# Patient Record
Sex: Female | Born: 1985
Health system: Southern US, Community
[De-identification: ages and names within clinical notes are randomized; demographics above are authoritative.]

## PROBLEM LIST (undated history)

## (undated) ENCOUNTER — Emergency Department (HOSPITAL_COMMUNITY): Admission: EM | Payer: 59 | Source: Home / Self Care

## (undated) DIAGNOSIS — K219 Gastro-esophageal reflux disease without esophagitis: Secondary | ICD-10-CM

## (undated) DIAGNOSIS — R519 Headache, unspecified: Secondary | ICD-10-CM

## (undated) DIAGNOSIS — T7840XA Allergy, unspecified, initial encounter: Secondary | ICD-10-CM

## (undated) DIAGNOSIS — F32A Depression, unspecified: Secondary | ICD-10-CM

## (undated) DIAGNOSIS — F419 Anxiety disorder, unspecified: Secondary | ICD-10-CM

## (undated) DIAGNOSIS — K602 Anal fissure, unspecified: Secondary | ICD-10-CM

## (undated) HISTORY — DX: Anal fissure, unspecified: K60.2

## (undated) HISTORY — DX: Depression, unspecified: F32.A

## (undated) HISTORY — DX: Allergy, unspecified, initial encounter: T78.40XA

## (undated) HISTORY — PX: LAPAROSCOPY ABDOMEN DIAGNOSTIC: PRO50

## (undated) HISTORY — DX: Gastro-esophageal reflux disease without esophagitis: K21.9

## (undated) HISTORY — DX: Anxiety disorder, unspecified: F41.9

---
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
## 2004-02-04 ENCOUNTER — Emergency Department (HOSPITAL_COMMUNITY): Admission: EM | Admit: 2004-02-04 | Discharge: 2004-02-05 | Payer: Self-pay | Admitting: Emergency Medicine

## 2004-02-12 ENCOUNTER — Emergency Department (HOSPITAL_COMMUNITY): Admission: EM | Admit: 2004-02-12 | Discharge: 2004-02-12 | Payer: Self-pay | Admitting: Family Medicine

## 2004-05-04 ENCOUNTER — Emergency Department (HOSPITAL_COMMUNITY): Admission: EM | Admit: 2004-05-04 | Discharge: 2004-05-04 | Payer: Self-pay | Admitting: *Deleted

## 2004-06-11 ENCOUNTER — Emergency Department (HOSPITAL_COMMUNITY): Admission: EM | Admit: 2004-06-11 | Discharge: 2004-06-11 | Payer: Self-pay | Admitting: Family Medicine

## 2004-06-14 ENCOUNTER — Emergency Department (HOSPITAL_COMMUNITY): Admission: EM | Admit: 2004-06-14 | Discharge: 2004-06-14 | Payer: Self-pay | Admitting: Family Medicine

## 2004-07-28 ENCOUNTER — Emergency Department (HOSPITAL_COMMUNITY): Admission: EM | Admit: 2004-07-28 | Discharge: 2004-07-28 | Payer: Self-pay | Admitting: Family Medicine

## 2004-07-28 ENCOUNTER — Ambulatory Visit (HOSPITAL_COMMUNITY): Admission: RE | Admit: 2004-07-28 | Discharge: 2004-07-28 | Payer: Self-pay | Admitting: Family Medicine

## 2004-12-05 ENCOUNTER — Emergency Department (HOSPITAL_COMMUNITY): Admission: EM | Admit: 2004-12-05 | Discharge: 2004-12-05 | Payer: Self-pay | Admitting: Family Medicine

## 2004-12-05 ENCOUNTER — Emergency Department (HOSPITAL_COMMUNITY): Admission: EM | Admit: 2004-12-05 | Discharge: 2004-12-05 | Payer: Self-pay | Admitting: Diagnostic Radiology

## 2005-01-07 ENCOUNTER — Emergency Department (HOSPITAL_COMMUNITY): Admission: EM | Admit: 2005-01-07 | Discharge: 2005-01-07 | Payer: Self-pay | Admitting: Family Medicine

## 2005-09-11 ENCOUNTER — Emergency Department (HOSPITAL_COMMUNITY): Admission: EM | Admit: 2005-09-11 | Discharge: 2005-09-11 | Payer: Self-pay | Admitting: Family Medicine

## 2005-11-01 ENCOUNTER — Emergency Department (HOSPITAL_COMMUNITY): Admission: EM | Admit: 2005-11-01 | Discharge: 2005-11-01 | Payer: Self-pay | Admitting: Family Medicine

## 2006-05-21 ENCOUNTER — Emergency Department (HOSPITAL_COMMUNITY): Admission: EM | Admit: 2006-05-21 | Discharge: 2006-05-21 | Payer: Self-pay | Admitting: Emergency Medicine

## 2006-11-03 ENCOUNTER — Emergency Department (HOSPITAL_COMMUNITY): Admission: EM | Admit: 2006-11-03 | Discharge: 2006-11-03 | Payer: Self-pay | Admitting: Family Medicine

## 2007-01-12 ENCOUNTER — Emergency Department (HOSPITAL_COMMUNITY): Admission: EM | Admit: 2007-01-12 | Discharge: 2007-01-12 | Payer: Self-pay | Admitting: Family Medicine

## 2007-05-02 ENCOUNTER — Emergency Department (HOSPITAL_COMMUNITY): Admission: EM | Admit: 2007-05-02 | Discharge: 2007-05-02 | Payer: Self-pay | Admitting: Emergency Medicine

## 2007-08-01 ENCOUNTER — Emergency Department (HOSPITAL_COMMUNITY): Admission: EM | Admit: 2007-08-01 | Discharge: 2007-08-01 | Payer: Self-pay | Admitting: Emergency Medicine

## 2008-07-12 ENCOUNTER — Emergency Department (HOSPITAL_COMMUNITY): Admission: EM | Admit: 2008-07-12 | Discharge: 2008-07-12 | Payer: Self-pay | Admitting: Emergency Medicine

## 2008-09-06 HISTORY — PX: HEMORROIDECTOMY: SUR656

## 2008-12-17 ENCOUNTER — Emergency Department (HOSPITAL_COMMUNITY): Admission: EM | Admit: 2008-12-17 | Discharge: 2008-12-17 | Payer: Self-pay | Admitting: Emergency Medicine

## 2009-01-18 ENCOUNTER — Emergency Department (HOSPITAL_COMMUNITY): Admission: EM | Admit: 2009-01-18 | Discharge: 2009-01-19 | Payer: Self-pay | Admitting: Emergency Medicine

## 2009-01-22 ENCOUNTER — Encounter (INDEPENDENT_AMBULATORY_CARE_PROVIDER_SITE_OTHER): Payer: Self-pay | Admitting: Surgery

## 2009-01-22 ENCOUNTER — Ambulatory Visit (HOSPITAL_COMMUNITY): Admission: RE | Admit: 2009-01-22 | Discharge: 2009-01-22 | Payer: Self-pay | Admitting: Surgery

## 2009-11-25 ENCOUNTER — Emergency Department (HOSPITAL_COMMUNITY): Admission: EM | Admit: 2009-11-25 | Discharge: 2009-11-25 | Payer: Self-pay | Admitting: Family Medicine

## 2009-11-25 ENCOUNTER — Emergency Department (HOSPITAL_COMMUNITY): Admission: EM | Admit: 2009-11-25 | Discharge: 2009-11-26 | Payer: Self-pay | Admitting: Emergency Medicine

## 2010-06-18 ENCOUNTER — Emergency Department (HOSPITAL_COMMUNITY): Admission: EM | Admit: 2010-06-18 | Discharge: 2010-06-18 | Payer: Self-pay | Admitting: Emergency Medicine

## 2010-08-29 ENCOUNTER — Emergency Department (HOSPITAL_COMMUNITY)
Admission: EM | Admit: 2010-08-29 | Discharge: 2010-08-29 | Payer: Self-pay | Source: Home / Self Care | Admitting: Emergency Medicine

## 2010-10-27 ENCOUNTER — Emergency Department (HOSPITAL_COMMUNITY)
Admission: EM | Admit: 2010-10-27 | Discharge: 2010-10-27 | Disposition: A | Discharge status: Home / Self Care | Payer: 59 | Attending: Emergency Medicine | Admitting: Emergency Medicine

## 2010-10-27 ENCOUNTER — Emergency Department (HOSPITAL_COMMUNITY): Payer: 59

## 2010-10-27 DIAGNOSIS — R6883 Chills (without fever): Secondary | ICD-10-CM | POA: Insufficient documentation

## 2010-10-27 DIAGNOSIS — Z79899 Other long term (current) drug therapy: Secondary | ICD-10-CM | POA: Insufficient documentation

## 2010-10-27 DIAGNOSIS — R109 Unspecified abdominal pain: Secondary | ICD-10-CM | POA: Insufficient documentation

## 2010-10-27 DIAGNOSIS — R10819 Abdominal tenderness, unspecified site: Secondary | ICD-10-CM | POA: Insufficient documentation

## 2010-10-27 DIAGNOSIS — R110 Nausea: Secondary | ICD-10-CM | POA: Insufficient documentation

## 2010-10-27 DIAGNOSIS — R197 Diarrhea, unspecified: Secondary | ICD-10-CM | POA: Insufficient documentation

## 2010-10-27 LAB — DIFFERENTIAL
Basophils Relative: 0 % (ref 0–1)
Lymphs Abs: 1.2 10*3/uL (ref 0.7–4.0)
Neutro Abs: 9.1 10*3/uL — ABNORMAL HIGH (ref 1.7–7.7)
Neutrophils Relative %: 85 % — ABNORMAL HIGH (ref 43–77)

## 2010-10-27 LAB — WET PREP, GENITAL: Clue Cells Wet Prep HPF POC: NONE SEEN

## 2010-10-27 LAB — URINE MICROSCOPIC-ADD ON

## 2010-10-27 LAB — COMPREHENSIVE METABOLIC PANEL
ALT: 15 U/L (ref 0–35)
Alkaline Phosphatase: 57 U/L (ref 39–117)
CO2: 25 mEq/L (ref 19–32)
Chloride: 106 mEq/L (ref 96–112)
Creatinine, Ser: 0.81 mg/dL (ref 0.4–1.2)
GFR calc Af Amer: >60 mL/min (ref >60)
GFR calc non Af Amer: >60 mL/min (ref >60)
Glucose, Bld: 112 mg/dL — ABNORMAL HIGH (ref 70–99)
Potassium: 3.6 mEq/L (ref 3.5–5.1)
Total Bilirubin: 1.1 mg/dL (ref 0.3–1.2)

## 2010-10-27 LAB — CBC
MCHC: 32.9 g/dL (ref 30.0–36.0)
Platelets: 282 10*3/uL (ref 150–400)
RDW: 12.6 % (ref 11.5–15.5)
WBC: 10.8 10*3/uL — ABNORMAL HIGH (ref 4.0–10.5)

## 2010-10-27 LAB — URINALYSIS, ROUTINE W REFLEX MICROSCOPIC
Bilirubin Urine: NEGATIVE
Protein, ur: NEGATIVE mg/dL
Specific Gravity, Urine: 1.015 (ref 1.005–1.030)
Urine Glucose, Fasting: NEGATIVE mg/dL
Urobilinogen, UA: 0.2 mg/dL (ref 0.0–1.0)
pH: 7.5 (ref 5.0–8.0)

## 2010-10-27 MED ORDER — IOHEXOL 300 MG/ML  SOLN
100.0000 mL | Freq: Once | INTRAMUSCULAR | Status: AC | PRN
  Administered 2010-10-27: 100 mL via INTRAVENOUS

## 2010-10-28 LAB — GC/CHLAMYDIA PROBE AMP, GENITAL: GC Probe Amp, Genital: NEGATIVE

## 2010-11-27 LAB — COMPREHENSIVE METABOLIC PANEL
ALT: 14 U/L (ref 0–35)
AST: 18 U/L (ref 0–37)
Alkaline Phosphatase: 75 U/L (ref 39–117)
CO2: 25 mEq/L (ref 19–32)
Chloride: 103 mEq/L (ref 96–112)
Creatinine, Ser: 0.85 mg/dL (ref 0.4–1.2)
GFR calc Af Amer: >60 mL/min (ref >60)
GFR calc non Af Amer: >60 mL/min (ref >60)
Potassium: 3.4 mEq/L — ABNORMAL LOW (ref 3.5–5.1)
Sodium: 138 mEq/L (ref 135–145)
Total Bilirubin: 0.5 mg/dL (ref 0.3–1.2)

## 2010-11-27 LAB — URINALYSIS, ROUTINE W REFLEX MICROSCOPIC
Bilirubin Urine: NEGATIVE
Glucose, UA: NEGATIVE mg/dL
Hgb urine dipstick: NEGATIVE
Protein, ur: NEGATIVE mg/dL
Urobilinogen, UA: 0.2 mg/dL (ref 0.0–1.0)

## 2010-11-27 LAB — POCT URINALYSIS DIP (DEVICE)
Bilirubin Urine: NEGATIVE
Glucose, UA: NEGATIVE mg/dL
Hgb urine dipstick: NEGATIVE
Nitrite: NEGATIVE

## 2010-11-27 LAB — DIFFERENTIAL
Basophils Absolute: 0 10*3/uL (ref 0.0–0.1)
Eosinophils Absolute: 0.1 10*3/uL (ref 0.0–0.7)
Lymphs Abs: 3.2 10*3/uL (ref 0.7–4.0)
Monocytes Absolute: 0.8 10*3/uL (ref 0.1–1.0)
Neutro Abs: 6.7 10*3/uL (ref 1.7–7.7)

## 2010-11-27 LAB — POCT PREGNANCY, URINE
Preg Test, Ur: NEGATIVE
Preg Test, Ur: NEGATIVE

## 2010-11-27 LAB — CBC
MCV: 88.2 fL (ref 78.0–100.0)
RBC: 4.33 MIL/uL (ref 3.87–5.11)
WBC: 10.8 10*3/uL — ABNORMAL HIGH (ref 4.0–10.5)

## 2010-11-27 LAB — URINE MICROSCOPIC-ADD ON

## 2010-12-15 LAB — DIFFERENTIAL
Basophils Absolute: 0.3 10*3/uL — ABNORMAL HIGH (ref 0.0–0.1)
Basophils Relative: 2 % — ABNORMAL HIGH (ref 0–1)
Eosinophils Absolute: 0.2 10*3/uL (ref 0.0–0.7)
Eosinophils Relative: 1 % (ref 0–5)
Monocytes Absolute: 0.8 10*3/uL (ref 0.1–1.0)
Monocytes Relative: 6 % (ref 3–12)
Neutro Abs: 8.7 10*3/uL — ABNORMAL HIGH (ref 1.7–7.7)

## 2010-12-15 LAB — URINALYSIS, ROUTINE W REFLEX MICROSCOPIC
Glucose, UA: NEGATIVE mg/dL
Hgb urine dipstick: NEGATIVE
Ketones, ur: NEGATIVE mg/dL
Protein, ur: NEGATIVE mg/dL
Urobilinogen, UA: 0.2 mg/dL (ref 0.0–1.0)

## 2010-12-15 LAB — CBC
Hemoglobin: 12.8 g/dL (ref 12.0–15.0)
MCHC: 33.1 g/dL (ref 30.0–36.0)
MCV: 86.8 fL (ref 78.0–100.0)
RBC: 4.46 MIL/uL (ref 3.87–5.11)
RDW: 13.1 % (ref 11.5–15.5)

## 2010-12-15 LAB — WET PREP, GENITAL: Yeast Wet Prep HPF POC: NONE SEEN

## 2010-12-15 LAB — POCT I-STAT, CHEM 8
BUN: 12 mg/dL (ref 6–23)
Calcium, Ion: 1.12 mmol/L (ref 1.12–1.32)
Chloride: 107 mEq/L (ref 96–112)
Creatinine, Ser: 0.9 mg/dL (ref 0.4–1.2)
Glucose, Bld: 89 mg/dL (ref 70–99)
TCO2: 21 mmol/L (ref 0–100)

## 2010-12-15 LAB — URINE MICROSCOPIC-ADD ON

## 2010-12-15 LAB — RPR: RPR Ser Ql: NONREACTIVE

## 2010-12-15 LAB — HEMOCCULT GUIAC POC 1CARD (OFFICE): Fecal Occult Bld: NEGATIVE

## 2010-12-16 LAB — DIFFERENTIAL
Basophils Absolute: 0.1 10*3/uL (ref 0.0–0.1)
Basophils Relative: 1 % (ref 0–1)
Eosinophils Relative: 4 % (ref 0–5)
Monocytes Absolute: 0.5 10*3/uL (ref 0.1–1.0)
Neutro Abs: 5.1 10*3/uL (ref 1.7–7.7)

## 2010-12-16 LAB — CBC
HCT: 37.3 % (ref 36.0–46.0)
Hemoglobin: 13.2 g/dL (ref 12.0–15.0)
MCV: 86.6 fL (ref 78.0–100.0)
Platelets: 240 10*3/uL (ref 150–400)
RBC: 4.31 MIL/uL (ref 3.87–5.11)
WBC: 7.8 10*3/uL (ref 4.0–10.5)

## 2010-12-16 LAB — COMPREHENSIVE METABOLIC PANEL
Albumin: 4 g/dL (ref 3.5–5.2)
Alkaline Phosphatase: 57 U/L (ref 39–117)
BUN: 10 mg/dL (ref 6–23)
CO2: 26 mEq/L (ref 19–32)
Chloride: 106 mEq/L (ref 96–112)
Creatinine, Ser: 0.79 mg/dL (ref 0.4–1.2)
GFR calc non Af Amer: >60 mL/min (ref >60)
Potassium: 3.7 mEq/L (ref 3.5–5.1)
Total Bilirubin: 0.7 mg/dL (ref 0.3–1.2)

## 2010-12-16 LAB — URINALYSIS, ROUTINE W REFLEX MICROSCOPIC
Bilirubin Urine: NEGATIVE
Hgb urine dipstick: NEGATIVE
Nitrite: NEGATIVE
Protein, ur: NEGATIVE mg/dL
Urobilinogen, UA: 0.2 mg/dL (ref 0.0–1.0)

## 2010-12-16 LAB — PREGNANCY, URINE: Preg Test, Ur: NEGATIVE

## 2010-12-16 LAB — GC/CHLAMYDIA PROBE AMP, GENITAL: Chlamydia, DNA Probe: NEGATIVE

## 2011-01-19 NOTE — Op Note (Signed)
NAME:  Alicia Beltran, Alicia Beltran         ACCOUNT NO.:  [id number]   MEDICAL RECORD NO.:  [id number]          PATIENT TYPE:  AMB   LOCATION:  DAY                          FACILITY:  [facility]   PHYSICIAN:  Wilmon Arms. Corliss Skains, M.D. DATE OF BIRTH:  1985/09/25   DATE OF PROCEDURE:  01/22/2009  DATE OF DISCHARGE:                               OPERATIVE REPORT   PREOPERATIVE DIAGNOSIS:  Perirectal pain   POSTOPERATIVE DIAGNOSIS:  Anal fissure, internal hemorrhoid.   PROCEDURE PERFORMED:  Examination under anesthesia and internal  hemorrhoidectomy.   SURGEON:  Wilmon Arms. Tsuei, M.D.   ANESTHESIA:  General.   INDICATIONS:  This is a 25 year old female who presents with several  weeks of severe perirectal pain located mostly in the posterior left  quadrant.  She had a course of antibiotics for a presumed early  perirectal abscess.  However, this did not seem to help.  She was  examined again and there was no sign of swelling or erythema.  She was  exquisitely tender and the decision was made to proceed for an  examination under anesthesia.   OPERATIVE FINDINGS:  The patient has a fairly large fissure in the left  posterior quadrant.  Just inside of this she has a small, nonprolapsing  internal hemorrhoid which did not appear to be bleeding.   DESCRIPTION OF PROCEDURE:  The patient was brought to the operating room  and placed in supine position on the operating room table.  After an  adequate level of general anesthesia was obtained the patient's legs  were placed in lithotomy position in Yellofin stirrups.  Her perineum  was prepped with Betadine and draped in a sterile fashion.  A time-out  was taken to ensure the proper patient and proper procedure.  We  infiltrated the area around the anus with quarter percent Marcaine with  epinephrine.  A lubricated finger was inserted to gently dilate the  sphincter.  We carefully examined the perianal skin all around.  There  was no sign of abscess.   There was no erythema.  There was no sign of  fistula.  On close examination the patient has a tiny bit of loose  external hemorrhoidal tissue and there was a fairly large fissure in the  left posterior quadrant.  This was not located in the midline.  We were  able to dilate the anus up to three fingers and then insert a  SilverBullet retractor.  Circumferential inspection showed only this  single fissure.  There was some oozing from this spot.  Just inside of  this above the dentate line there was an internal hemorrhoid which is  long but not inflamed or bleeding.  This was excised with cautery.  Circumferential inspection showed no other significant findings in the  rectum.  We thoroughly infiltrated with quarter percent Marcaine with  epinephrine.  I used a 3-0 Vicryl to close the mucosal edges of our  internal hemorrhoidectomy site.  We incorporated  the edges of the fissure in this closure.  This area was then covered  with a small piece of Gelfoam covered in dibucaine ointment.  The  retractors were  removed.  A dry dressing was placed and the patient was  extubated and brought to recovery in stable condition.  All sponge,  instrument and needle counts were correct.      Wilmon Arms. Tsuei, M.D.  Electronically Signed     MKT/MEDQ  D:  01/22/2009  T:  01/22/2009  Job:  [id number]

## 2011-01-22 NOTE — Op Note (Signed)
[facility]  Patient:    Alicia Beltran, Alicia Beltran Visit Number: [id number] MRN: [id number]          Service Type: EMS Location: [facility] Attending Physician:  Devoria Albe Dictated by:   S. Kyra Manges, M.D. Proc. Date: 09/22/01 Admit Date:  09/08/2001 Discharge Date: 09/08/2001                             Operative Report  PREOPERATIVE DIAGNOSES: 1. Pelvic pain. 2. Abdominal pain of uncertain etiology.  POSTOPERATIVE DIAGNOSIS:  Essentially normal laparoscopy.  DESCRIPTION OF PROCEDURE:  The patient was placed in lithotomy position and prepped and draped in the usual fashion.  Bladder was emptied.  The Hulka elevator was inserted into the cervix.  Transverse incision was made in the abdomen after appropriate prep and drape, and a Veress needle was used to insufflate the abdominal cavity.  Aspiration infusion technique was utilized. The trocar was inserted into the abdomen, and visualization of the pelvis revealed two normal tubes and ovaries.  There was no evidence of endometriosis.  Both ureters peristalsed nicely.  The cecum was visualized; it was normal.  There was no bowel erythema or thickening that we could appreciate on laparoscopy.  The appendix was visualized and normal.  The gallbladder was normal.  The liver was normal.  There were no omental thickenings or changes in the omentum indicating any inflammatory process within the abdomen.  We looked anteriorly and posteriorly, particularly in the cul-de-sac, traced the uterosacral ligaments to their insertion and found absolutely nothing within her abdomen that we could find that would be causing her abdominal pain.  The gas was then evacuated and the incisions closed with 3-0 Dexon, and the Hulka elevator was removed.  [name] tolerated this procedure well and was sent to the recovery room in good condition. Dictated by:   S. Kyra Manges, M.D. Attending Physician:  Devoria Albe DD:  09/22/01 TD:   09/24/01 Job: [id number]

## 2011-03-04 ENCOUNTER — Emergency Department (HOSPITAL_COMMUNITY): Payer: No Typology Code available for payment source

## 2011-03-04 ENCOUNTER — Emergency Department (HOSPITAL_COMMUNITY)
Admission: EM | Admit: 2011-03-04 | Discharge: 2011-03-04 | Disposition: A | Discharge status: Home / Self Care | Payer: No Typology Code available for payment source | Attending: Emergency Medicine | Admitting: Emergency Medicine

## 2011-03-04 DIAGNOSIS — M545 Low back pain, unspecified: Secondary | ICD-10-CM | POA: Insufficient documentation

## 2011-03-04 DIAGNOSIS — M542 Cervicalgia: Secondary | ICD-10-CM | POA: Insufficient documentation

## 2011-03-26 ENCOUNTER — Emergency Department (HOSPITAL_COMMUNITY)
Admission: EM | Admit: 2011-03-26 | Discharge: 2011-03-26 | Disposition: A | Discharge status: Home / Self Care | Payer: No Typology Code available for payment source | Attending: Emergency Medicine | Admitting: Emergency Medicine

## 2011-03-26 DIAGNOSIS — M549 Dorsalgia, unspecified: Secondary | ICD-10-CM | POA: Insufficient documentation

## 2011-03-26 DIAGNOSIS — M542 Cervicalgia: Secondary | ICD-10-CM | POA: Insufficient documentation

## 2011-06-08 LAB — POCT I-STAT, CHEM 8
BUN: 10
Calcium, Ion: 1.23
Chloride: 105
Creatinine, Ser: 0.9
Glucose, Bld: 84
HCT: 39
Hemoglobin: 13.3
Potassium: 3.7
Sodium: 140
TCO2: 25

## 2011-06-08 LAB — URINALYSIS, ROUTINE W REFLEX MICROSCOPIC
Glucose, UA: NEGATIVE
Nitrite: NEGATIVE
Protein, ur: NEGATIVE

## 2011-06-08 LAB — URINE MICROSCOPIC-ADD ON

## 2011-06-15 LAB — GC/CHLAMYDIA PROBE AMP, GENITAL: Chlamydia, DNA Probe: NEGATIVE

## 2011-06-15 LAB — URINALYSIS, ROUTINE W REFLEX MICROSCOPIC
Ketones, ur: NEGATIVE
Protein, ur: NEGATIVE
Urobilinogen, UA: 1

## 2011-06-15 LAB — RPR: RPR Ser Ql: NONREACTIVE

## 2011-06-15 LAB — PREGNANCY, URINE: Preg Test, Ur: NEGATIVE

## 2011-06-18 LAB — URINALYSIS, ROUTINE W REFLEX MICROSCOPIC
Bilirubin Urine: NEGATIVE
Glucose, UA: NEGATIVE
Hgb urine dipstick: NEGATIVE
Protein, ur: NEGATIVE
Urobilinogen, UA: 0.2

## 2011-06-18 LAB — POCT PREGNANCY, URINE: Preg Test, Ur: NEGATIVE

## 2011-06-25 ENCOUNTER — Other Ambulatory Visit (HOSPITAL_COMMUNITY): Payer: Self-pay | Admitting: Psychiatry

## 2011-06-25 DIAGNOSIS — M5020 Other cervical disc displacement, unspecified cervical region: Secondary | ICD-10-CM

## 2011-06-28 ENCOUNTER — Ambulatory Visit (HOSPITAL_COMMUNITY)
Admission: RE | Admit: 2011-06-28 | Discharge: 2011-06-28 | Disposition: A | Discharge status: Home / Self Care | Payer: 59 | Source: Ambulatory Visit | Attending: Psychiatry | Admitting: Psychiatry

## 2011-06-28 DIAGNOSIS — M5020 Other cervical disc displacement, unspecified cervical region: Secondary | ICD-10-CM

## 2011-06-28 DIAGNOSIS — M542 Cervicalgia: Secondary | ICD-10-CM | POA: Insufficient documentation

## 2011-06-28 DIAGNOSIS — M25519 Pain in unspecified shoulder: Secondary | ICD-10-CM | POA: Insufficient documentation

## 2011-06-30 ENCOUNTER — Other Ambulatory Visit (HOSPITAL_COMMUNITY): Payer: Self-pay | Admitting: Family Medicine

## 2011-08-30 ENCOUNTER — Emergency Department (HOSPITAL_COMMUNITY)
Admission: EM | Admit: 2011-08-30 | Discharge: 2011-08-30 | Disposition: A | Discharge status: Home / Self Care | Payer: PRIVATE HEALTH INSURANCE | Attending: Emergency Medicine | Admitting: Emergency Medicine

## 2011-08-30 ENCOUNTER — Encounter: Payer: Self-pay | Admitting: *Deleted

## 2011-08-30 DIAGNOSIS — Y9210 Unspecified residential institution as the place of occurrence of the external cause: Secondary | ICD-10-CM | POA: Insufficient documentation

## 2011-08-30 DIAGNOSIS — W503XXA Accidental bite by another person, initial encounter: Secondary | ICD-10-CM

## 2011-08-30 DIAGNOSIS — Y990 Civilian activity done for income or pay: Secondary | ICD-10-CM | POA: Insufficient documentation

## 2011-08-30 DIAGNOSIS — S51809A Unspecified open wound of unspecified forearm, initial encounter: Secondary | ICD-10-CM | POA: Insufficient documentation

## 2011-08-30 MED ORDER — AMOXICILLIN-POT CLAVULANATE 500-125 MG PO TABS: 1.0000 | ORAL_TABLET | Freq: Three times a day (TID) | ORAL | Status: AC

## 2011-08-30 MED ORDER — TETANUS-DIPHTH-ACELL PERTUSSIS 5-2.5-18.5 LF-MCG/0.5 IM SUSP
0.5000 mL | Freq: Once | INTRAMUSCULAR | Status: AC
  Administered 2011-08-30: 0.5 mL via INTRAMUSCULAR
  Filled 2011-08-30: qty 0.5

## 2011-08-30 MED ORDER — IBUPROFEN 800 MG PO TABS
800.0000 mg | ORAL_TABLET | Freq: Once | ORAL | Status: AC
  Administered 2011-08-30: 800 mg via ORAL
  Filled 2011-08-30: qty 1
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
## 2011-08-30 NOTE — ED Provider Notes (Signed)
Medical screening examination/treatment/procedure(s) were performed by non-physician practitioner and as supervising physician I was immediately available for consultation/collaboration.   Sharika Mosquera, MD 08/30/11 2317 
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
## 2012-02-25 ENCOUNTER — Encounter (HOSPITAL_COMMUNITY): Payer: Self-pay | Admitting: *Deleted

## 2012-02-25 ENCOUNTER — Emergency Department (HOSPITAL_COMMUNITY)
Admission: EM | Admit: 2012-02-25 | Discharge: 2012-02-25 | Disposition: A | Discharge status: Home / Self Care | Payer: 59 | Attending: Emergency Medicine | Admitting: Emergency Medicine

## 2012-02-25 DIAGNOSIS — I951 Orthostatic hypotension: Secondary | ICD-10-CM

## 2012-02-25 DIAGNOSIS — I498 Other specified cardiac arrhythmias: Secondary | ICD-10-CM | POA: Insufficient documentation

## 2012-02-25 DIAGNOSIS — R002 Palpitations: Secondary | ICD-10-CM | POA: Insufficient documentation

## 2012-02-25 LAB — BASIC METABOLIC PANEL
GFR calc Af Amer: >90 mL/min (ref >90)
GFR calc non Af Amer: >90 mL/min (ref >90)
Glucose, Bld: 119 mg/dL — ABNORMAL HIGH (ref 70–99)
Potassium: 4.2 mEq/L (ref 3.5–5.1)
Sodium: 138 mEq/L (ref 135–145)

## 2012-02-25 LAB — CBC
Hemoglobin: 12.8 g/dL (ref 12.0–15.0)
MCHC: 33.4 g/dL (ref 30.0–36.0)

## 2012-02-25 LAB — GLUCOSE, CAPILLARY: Glucose-Capillary: 112 mg/dL — ABNORMAL HIGH (ref 70–99)

## 2012-02-25 MED ORDER — GI COCKTAIL ~~LOC~~
30.0000 mL | Freq: Once | ORAL | Status: AC
  Administered 2012-02-25: 30 mL via ORAL
  Filled 2012-02-25: qty 30

## 2012-02-25 MED ORDER — SODIUM CHLORIDE 0.9 % IV BOLUS (SEPSIS)
1000.0000 mL | Freq: Once | INTRAVENOUS | Status: AC
  Administered 2012-02-25: 1000 mL via INTRAVENOUS

## 2012-02-25 NOTE — Discharge Instructions (Signed)
If your symptoms get worse or do not improve with fluids then return to the doctor or emergency room. Make sure to not get up too quickly and do not exert yourself for next 24 hours.  Orthostatic Hypotension Orthostatic hypotension is a sudden fall in blood pressure. It occurs when a person goes from a sitting or lying position to a standing position. CAUSES   Loss of body fluids (dehydration).   Medicines that lower blood pressure.   Sudden changes in posture, such as sudden standing when you have been sitting or lying down.   Taking too much of your medicine.  SYMPTOMS   Lightheadedness or dizziness.   Fainting or near-fainting.   A fast heart rate (tachycardia).   Weakness.   Feeling tired (fatigue).  DIAGNOSIS  Your caregiver may find the cause of orthostatic hypotension through:  A history and/or physical exam.   Checking your blood pressure. Your caregiver will check your blood pressure when you are:   Lying down.   Sitting.   Standing.   Tilt table testing. In this test, you are placed on a table that goes from a lying position to a standing position. You will be strapped to the table. This test helps to monitor your blood pressure and heart rate when you are in different positions.  TREATMENT   If orthostatic hypotension is caused by your medicines, your caregiver will need to adjust your dosage. Do not stop or adjust your medicine on your own.   When changing positions, make these changes slowly. This allows your body to adjust to the different position.   Compression stockings that are worn on your lower legs may be helpful.   Your caregiver may have you consume extra salt. Do not add extra salt to your diet unless directed by your caregiver.   Eat frequent, small meals. Avoid sudden standing after eating.   Avoid hot showers or excessive heat.   Your caregiver may give you fluids through the vein (intravenous).   Your caregiver may put you on medicine  to help enhance fluid retention.  SEEK IMMEDIATE MEDICAL CARE IF:   You faint or have a near-fainting episode. Call your local emergency services (911 in [place]   You have or develop chest pain.   You feel sick to your stomach (nauseous) or vomit.   You have a loss of feeling or movement in your arms or legs.   You have difficulty talking, slurred speech, or you are unable to talk.   You have difficulty thinking or have confused thinking.  MAKE SURE YOU:   Understand these instructions.   Will watch your condition.   Will get help right away if you are not doing well or get worse.  Document Released: 08/13/2002 Document Revised: 08/12/2011 Document Reviewed: 12/06/2008 [facility] Patient Information 2012 [place], [place].

## 2012-02-25 NOTE — ED Notes (Signed)
Will ambulate pt when IV bag is empty.

## 2012-02-25 NOTE — ED Notes (Signed)
Pt was donating blood. Completed donation process and went back to work, pushed a hospital bed around and upon returning, felt lightheaded and was having palpitations. Co-worker reported pt's pulse being 140-150. Attempted to vaso-vagal without relief. Upon admission to ED, pts HR 103 on monitor but she continues to complain of palpitations and lightheadedness. Sts no previous similar symptoms, no previous adverse reaction after donating blood.

## 2012-02-25 NOTE — ED Provider Notes (Signed)
History     CSN: [id number]  Arrival date & time 02/25/12  1354   First MD Initiated Contact with Patient 02/25/12 1418      Chief Complaint  Patient presents with  . Palpitations    (Consider location/radiation/quality/duration/timing/severity/associated sxs/prior treatment) HPI Comments: 26 yo female with no significant PMH presents with palpitations after giving blood about one hour before. She has felt normal recently, no medications, eating and drinking normally. Gave blood without complications. 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 Did not resolve immediately so she presented for evaluation. No cardiac history, chest pain, dyspnea, leg swelling, weight changes, emesis, nausea. Symptoms are improved in bed currently.  Patient is a 26 y.o. female presenting with palpitations.  Palpitations  Pertinent negatives include no fever, no chest pain, no headaches and no shortness of breath.    History reviewed. No pertinent past medical history.  Past Surgical History  Procedure Date  . Laparoscopy abdomen diagnostic     No family history on file.  History  Substance Use Topics  . Smoking status: Never Smoker   . Smokeless tobacco: Not on file  . Alcohol Use: Yes     occ    OB History    Grav Para Term Preterm Abortions TAB SAB Ect Mult Living                  Review of Systems  Constitutional: Negative for fever, chills and activity change.  Respiratory: Negative for chest tightness and shortness of breath.   Cardiovascular: Positive for palpitations. Negative for chest pain and leg swelling.  Genitourinary: Negative for dysuria and hematuria.  Skin: Negative for rash.  Neurological: Negative for headaches.  All other systems reviewed and are negative.    Allergies  Review of patient's allergies indicates no known allergies.  Home Medications  No current outpatient  prescriptions on file.  BP 118/82  Pulse 114  Temp 97.6 F (36.4 C) (Oral)  SpO2 100%  LMP 12/28/2011  Physical Exam  Vitals reviewed. Constitutional: She is oriented to person, place, and time. She appears well-developed and well-nourished. No distress.  HENT:  Head: Normocephalic and atraumatic.       Lips are dry.  Eyes: EOM are normal. Pupils are equal, round, and reactive to light.  Neck: No thyromegaly present.  Cardiovascular: Regular rhythm, normal heart sounds and intact distal pulses.   No murmur heard.      Mild tachycardia  Pulmonary/Chest: Effort normal and breath sounds normal. No respiratory distress. She has no wheezes.  Abdominal: Soft. There is no tenderness.  Musculoskeletal: She exhibits no edema and no tenderness.  Neurological: She is alert and oriented to person, place, and time. No cranial nerve deficit. She exhibits normal muscle tone. Coordination normal.  Skin: No rash noted. She is not diaphoretic.  Psychiatric: She has a normal mood and affect.    ED Course  Procedures (including critical care time)  Date: 02/25/2012  Rate: 102  Rhythm: normal sinus rhythm  QRS Axis: normal  Intervals: normal  ST/T Wave abnormalities: nonspecific T wave changes  Conduction Disutrbances:none  Narrative Interpretation:   Old EKG Reviewed: none available    Labs Reviewed  GLUCOSE, CAPILLARY - Abnormal; Notable for the following:    Glucose-Capillary 112 (*)     All other components within normal limits  CBC  BASIC METABOLIC PANEL  TSH   Results for  orders placed during the hospital encounter of 02/25/12 (from the past 24 hour(s))  GLUCOSE, CAPILLARY     Status: Abnormal   Collection Time   02/25/12  2:32 PM      Component Value Range   Glucose-Capillary 112 (*) 70 - 99 mg/dL   Comment 1 Notify RN     Comment 2 Documented in Chart       No results found.   No diagnosis found.    MDM  25 yo female with palpitations, sinus tachycardia after  donating blood. This resolves with rest. Orthostatics are positive. No red flag symptoms. Will check CBC and hydrate one L bolus and follow up orthostatics.  3:27 PM Labs are normal, likely some dehydration and orthostasis immediately after blood donation. Discussed oral hydration, follow up for continued symptoms, worsening or syncope.       Durwin Reges, MD 02/25/12 [phone]

## 2012-02-27 NOTE — ED Provider Notes (Signed)
I  reviewed the resident's note and I agree with the findings and plan.      Nelia Shi, MD 02/27/12 [phone]

## 2013-02-09 ENCOUNTER — Encounter (HOSPITAL_COMMUNITY): Payer: Self-pay | Admitting: Emergency Medicine

## 2013-02-09 ENCOUNTER — Emergency Department (HOSPITAL_COMMUNITY)
Admission: EM | Admit: 2013-02-09 | Discharge: 2013-02-09 | Disposition: A | Discharge status: Home / Self Care | Payer: 59 | Source: Home / Self Care | Attending: Emergency Medicine | Admitting: Emergency Medicine

## 2013-02-09 DIAGNOSIS — R42 Dizziness and giddiness: Secondary | ICD-10-CM

## 2013-02-09 LAB — POCT I-STAT, CHEM 8
Creatinine, Ser: 0.7 mg/dL (ref 0.50–1.10)
Hemoglobin: 13.9 g/dL (ref 12.0–15.0)
Potassium: 3.6 mEq/L (ref 3.5–5.1)
Sodium: 142 mEq/L (ref 135–145)
TCO2: 24 mmol/L (ref 0–100)

## 2013-02-09 LAB — POCT URINALYSIS DIP (DEVICE)
Glucose, UA: NEGATIVE mg/dL
Ketones, ur: NEGATIVE mg/dL
Leukocytes, UA: NEGATIVE
Specific Gravity, Urine: 1.015 (ref 1.005–1.030)
Urobilinogen, UA: 0.2 mg/dL (ref 0.0–1.0)

## 2013-02-09 LAB — POCT PREGNANCY, URINE: Preg Test, Ur: NEGATIVE

## 2013-02-09 NOTE — ED Provider Notes (Signed)
Chief Complaint:   Chief Complaint  Patient presents with  . 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 She denies any headache, diplopia, difficulty with speech or swallowing, chest pain, shortness of breath, palpitations, vomiting, abdominal pain, diarrhea, urinary symptoms, GYN complaints, paresthesias, muscle weakness, or difficulty with coordination. She denies any whirling vertigo.  Review of Systems:  Other than noted above, the patient denies any of the following symptoms: Systemic:  No fever, chills, fatigue, or weight loss. Eye:  No blurred vision, visual change or diplopia. ENT:  No ear pain, tinnitus, hearing loss, nasal congestion, or rhinorrhea. Cardiac:  No chest pain, dyspnea, palpitations or syncope. Neuro:  No headache, paresthesias, weakness, trouble with speech, coordination or ambulation.  PMFSH:  Past medical history, family history, social history, meds, and allergies were reviewed.    Physical Exam:   Vital signs:  BP 126/92  Pulse 91  LMP 02/09/2013 Filed Vitals:   02/09/13 1445 02/09/13 1940 Supine  02/09/13 1940 Sitting  02/09/13 1941 Standing   BP: 126/92 141/97 129/92 126/92  Pulse: 91 74 77 91   General:  Alert, oriented times 3, in no distress. Eye:  PERRL, full EOM, no nystagmus. ENT:  TMs and canals normal.  Nasal mucosa normal.  Pharynx clear. Neck:  Supple, no adenopathy, tenderness, or mass.  Thyroid normal.  No carotid bruit. Lungs:  Breath sounds clear and equal bilaterally.  No wheezes, rales or rhonchi. Heart:  Regular rhythm.  No  gallops, murmers, or rubs. Neuro:  Alert and oriented times 3.  Cranial nerves intact.  No pronator drift.  Finger to nose normal.  No focal weakness.  Sensation intact to light touch.  Romberg's sign negative, gait normal.  She had a bit of difficulty and unsteadiness with tandem gait, but was able to walk steadily with a normal gait otherwise.  Dix-Hallpike maneuver was negative.  Labs:   Results for orders placed during the hospital encounter of 02/09/13  POCT PREGNANCY, URINE      Result Value Range   Preg Test, Ur NEGATIVE  NEGATIVE  POCT URINALYSIS DIP (DEVICE)      Result Value Range   Glucose, UA NEGATIVE  NEGATIVE mg/dL   Bilirubin Urine NEGATIVE  NEGATIVE   Ketones, ur NEGATIVE  NEGATIVE mg/dL   Specific Gravity, Urine 1.015  1.005 - 1.030   Hgb urine dipstick LARGE (*) NEGATIVE   pH 7.5  5.0 - 8.0   Protein, ur NEGATIVE  NEGATIVE mg/dL   Urobilinogen, UA 0.2  0.0 - 1.0 mg/dL   Nitrite NEGATIVE  NEGATIVE   Leukocytes, UA NEGATIVE  NEGATIVE  POCT I-STAT, CHEM 8      Result Value Range   Sodium 142  135 - 145 mEq/L   Potassium 3.6  3.5 - 5.1 mEq/L   Chloride 108  96 - 112 mEq/L   BUN 10  6 - 23 mg/dL   Creatinine, Ser 0.98  0.50 - 1.10 mg/dL   Glucose, Bld 93  70 - 99 mg/dL  Calcium, Ion 1.16  1.12 - 1.23 mmol/L   TCO2 24  0 - 100 mmol/L   Hemoglobin 13.9  12.0 - 15.0 g/dL   HCT 16.1  09.6 - 04.5 %     EKG Results:  Date: 02/09/2013  Rate: 93  Rhythm: normal sinus rhythm  QRS Axis: normal  Intervals: normal  ST/T Wave abnormalities: normal  Conduction Disutrbances:none  Narrative Interpretation: Normal sinus rhythm, normal EKG.  Old EKG Reviewed: none available  Assessment:  The encounter diagnosis was Dizziness.  Of uncertain cause. This does not appear to be vertigo. She does have mild postural hypotension. May be mildly dehydrated.  Plan:   1.  The following meds were prescribed:  There are no discharge medications for this patient.  2.  The patient was  instructed in symptomatic care and handouts were given. Suggested rest next fluids. 3.  The patient was told to return if becoming worse in any way, if no better in 3 or 4 days, and given some red flag symptoms such as fever, syncope, chest pain, shortness of breath that would indicate earlier return. 4.  Follow up here or at the emergency room as needed.    Reuben Likes, MD 02/09/13 [phone]

## 2013-02-09 NOTE — ED Notes (Signed)
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  Also c/o stiff neck and not walking straight, hot flashes Denies: HA, blurry vision She is alert and oriented w/no signs of acute distress.

## 2013-07-10 ENCOUNTER — Other Ambulatory Visit (HOSPITAL_COMMUNITY): Payer: Self-pay | Admitting: Radiology

## 2013-07-10 ENCOUNTER — Ambulatory Visit (HOSPITAL_COMMUNITY)
Admission: RE | Admit: 2013-07-10 | Discharge: 2013-07-10 | Disposition: A | Discharge status: Home / Self Care | Payer: PRIVATE HEALTH INSURANCE | Source: Ambulatory Visit | Attending: Radiology | Admitting: Radiology

## 2013-07-10 ENCOUNTER — Ambulatory Visit (HOSPITAL_COMMUNITY)
Admission: RE | Admit: 2013-07-10 | Discharge: 2013-07-10 | Disposition: A | Discharge status: Home / Self Care | Payer: PRIVATE HEALTH INSURANCE | Source: Ambulatory Visit | Attending: Orthopedic Surgery | Admitting: Orthopedic Surgery

## 2013-07-10 ENCOUNTER — Other Ambulatory Visit (HOSPITAL_COMMUNITY): Payer: Self-pay | Admitting: Orthopedic Surgery

## 2013-07-10 DIAGNOSIS — M542 Cervicalgia: Secondary | ICD-10-CM

## 2013-07-10 DIAGNOSIS — M5412 Radiculopathy, cervical region: Secondary | ICD-10-CM | POA: Insufficient documentation

## 2015-09-19 MED FILL — MEDROXYPROGESTERONE 10 MG T: 10 | 10 days supply | Qty: 10 | Fill #0

## 2015-10-06 MED FILL — CLOMIPHENE CITRATE 50 MG TA: 50 | 5 days supply | Qty: 5 | Fill #0

## 2015-10-30 DIAGNOSIS — M76811 Anterior tibial syndrome, right leg: Secondary | ICD-10-CM | POA: Diagnosis not present

## 2015-11-12 MED FILL — predniSONE 5 MG (21) TBPK: 5 | 6 days supply | Qty: 21 | Fill #0

## 2015-11-26 MED FILL — MEDROXYPROGESTERONE 10 MG T: 10 | 7 days supply | Qty: 7 | Fill #0

## 2015-12-10 MED FILL — CLOMIPHENE CITRATE 50 MG TA: 50 | 5 days supply | Qty: 5 | Fill #0

## 2015-12-25 MED FILL — predniSONE 5 MG (21) TBPK: 5 | 6 days supply | Qty: 21 | Fill #1

## 2016-01-01 DIAGNOSIS — N970 Female infertility associated with anovulation: Secondary | ICD-10-CM | POA: Diagnosis not present

## 2016-01-09 MED FILL — MEDROXYPROGESTERONE 10 MG T: 10 | 7 days supply | Qty: 7 | Fill #0

## 2016-01-14 MED FILL — predniSONE 5 MG (21) TBPK: 5 | 6 days supply | Qty: 21 | Fill #2

## 2016-01-22 DIAGNOSIS — M76811 Anterior tibial syndrome, right leg: Secondary | ICD-10-CM | POA: Diagnosis not present

## 2016-04-01 MED FILL — CELECOXIB 200 MG CAPSULE: 200 | 30 days supply | Qty: 30 | Fill #0

## 2016-04-30 DIAGNOSIS — M84374A Stress fracture, right foot, initial encounter for fracture: Secondary | ICD-10-CM | POA: Diagnosis not present

## 2016-04-30 DIAGNOSIS — E559 Vitamin D deficiency, unspecified: Secondary | ICD-10-CM | POA: Diagnosis not present

## 2016-05-07 DIAGNOSIS — M84374D Stress fracture, right foot, subsequent encounter for fracture with routine healing: Secondary | ICD-10-CM | POA: Diagnosis not present

## 2016-05-07 DIAGNOSIS — E559 Vitamin D deficiency, unspecified: Secondary | ICD-10-CM | POA: Diagnosis not present

## 2016-05-07 MED FILL — VIT D2 1.25 MG (50,000 UNIT: 1.25 MG | 56 days supply | Qty: 8 | Fill #0

## 2016-05-21 DIAGNOSIS — M84374D Stress fracture, right foot, subsequent encounter for fracture with routine healing: Secondary | ICD-10-CM | POA: Diagnosis not present

## 2016-06-04 DIAGNOSIS — M84374D Stress fracture, right foot, subsequent encounter for fracture with routine healing: Secondary | ICD-10-CM | POA: Diagnosis not present

## 2017-09-23 DIAGNOSIS — Z130 Encounter for screening for diseases of the blood and blood-forming organs and certain disorders involving the immune mechanism: Secondary | ICD-10-CM | POA: Diagnosis not present

## 2017-09-23 DIAGNOSIS — F520 Hypoactive sexual desire disorder: Secondary | ICD-10-CM | POA: Diagnosis not present

## 2017-09-23 DIAGNOSIS — Z124 Encounter for screening for malignant neoplasm of cervix: Secondary | ICD-10-CM | POA: Diagnosis not present

## 2017-09-23 DIAGNOSIS — Z1151 Encounter for screening for human papillomavirus (HPV): Secondary | ICD-10-CM | POA: Diagnosis not present

## 2017-09-23 DIAGNOSIS — N926 Irregular menstruation, unspecified: Secondary | ICD-10-CM | POA: Diagnosis not present

## 2017-09-23 DIAGNOSIS — Z01419 Encounter for gynecological examination (general) (routine) without abnormal findings: Secondary | ICD-10-CM | POA: Diagnosis not present

## 2017-09-23 DIAGNOSIS — E282 Polycystic ovarian syndrome: Secondary | ICD-10-CM | POA: Diagnosis not present

## 2017-09-23 DIAGNOSIS — Z1389 Encounter for screening for other disorder: Secondary | ICD-10-CM | POA: Diagnosis not present

## 2017-09-23 DIAGNOSIS — L680 Hirsutism: Secondary | ICD-10-CM | POA: Diagnosis not present

## 2017-10-05 DIAGNOSIS — H52223 Regular astigmatism, bilateral: Secondary | ICD-10-CM | POA: Diagnosis not present

## 2017-10-05 DIAGNOSIS — H5213 Myopia, bilateral: Secondary | ICD-10-CM | POA: Diagnosis not present

## 2017-10-14 DIAGNOSIS — Z20828 Contact with and (suspected) exposure to other viral communicable diseases: Secondary | ICD-10-CM | POA: Diagnosis not present

## 2017-10-14 DIAGNOSIS — J111 Influenza due to unidentified influenza virus with other respiratory manifestations: Secondary | ICD-10-CM | POA: Diagnosis not present

## 2017-12-06 ENCOUNTER — Other Ambulatory Visit: Payer: Self-pay

## 2017-12-06 DIAGNOSIS — Z1321 Encounter for screening for nutritional disorder: Secondary | ICD-10-CM

## 2017-12-06 DIAGNOSIS — Z0000 Encounter for general adult medical examination without abnormal findings: Secondary | ICD-10-CM

## 2017-12-06 DIAGNOSIS — Z13220 Encounter for screening for lipoid disorders: Secondary | ICD-10-CM

## 2017-12-06 DIAGNOSIS — Z1329 Encounter for screening for other suspected endocrine disorder: Secondary | ICD-10-CM

## 2017-12-21 ENCOUNTER — Other Ambulatory Visit: Payer: Self-pay | Admitting: Family Medicine

## 2017-12-21 ENCOUNTER — Other Ambulatory Visit: Payer: Self-pay | Admitting: Internal Medicine

## 2017-12-21 DIAGNOSIS — Z0000 Encounter for general adult medical examination without abnormal findings: Secondary | ICD-10-CM

## 2017-12-22 ENCOUNTER — Other Ambulatory Visit: Payer: Self-pay | Admitting: Internal Medicine

## 2017-12-29 ENCOUNTER — Other Ambulatory Visit: Payer: Self-pay | Admitting: Internal Medicine

## 2018-01-03 ENCOUNTER — Encounter: Payer: Self-pay | Admitting: Internal Medicine

## 2018-01-25 ENCOUNTER — Telehealth: Payer: 59 | Admitting: Family

## 2018-01-25 DIAGNOSIS — R399 Unspecified symptoms and signs involving the genitourinary system: Secondary | ICD-10-CM | POA: Diagnosis not present

## 2018-01-25 MED ORDER — CEPHALEXIN 500 MG PO CAPS: 500.0000 mg | ORAL_CAPSULE | Freq: Two times a day (BID) | ORAL | 0 refills | Status: DC

## 2018-01-25 MED FILL — CEPHALEXIN 500 MG CAPSULE: 500 | 7 days supply | Qty: 14 | Fill #0

## 2018-01-25 NOTE — Progress Notes (Signed)

## 2018-02-06 ENCOUNTER — Telehealth: Payer: 59 | Admitting: Family

## 2018-02-06 DIAGNOSIS — B9689 Other specified bacterial agents as the cause of diseases classified elsewhere: Secondary | ICD-10-CM

## 2018-02-06 DIAGNOSIS — J329 Chronic sinusitis, unspecified: Secondary | ICD-10-CM

## 2018-02-06 MED ORDER — BENZONATATE 100 MG PO CAPS: 100.0000 mg | ORAL_CAPSULE | Freq: Three times a day (TID) | ORAL | 0 refills | Status: DC | PRN

## 2018-02-06 MED ORDER — AMOXICILLIN-POT CLAVULANATE 875-125 MG PO TABS: 1.0000 | ORAL_TABLET | Freq: Two times a day (BID) | ORAL | 0 refills | Status: AC

## 2018-02-06 NOTE — Progress Notes (Signed)
Thank you for the details you included in the comment boxes. Those details are very helpful in determining the best course of treatment for you and help us to provide the best care.  We are sorry that you are not feeling well.  Here is how we plan to help!  Based on what you have shared with me it looks like you have sinusitis.  Sinusitis is inflammation and infection in the sinus cavities of the head.  Based on your presentation I believe you most likely have Acute Bacterial Sinusitis.  This is an infection caused by bacteria and is treated with antibiotics. I have prescribed Augmentin 875mg /125mg  one tablet twice daily with food, for 7 days. With Tessalon Perles 100mg , take 1-2 capsules every 8 hours as needed for cough.  You may use an oral decongestant such as Mucinex D or if you have glaucoma or high blood pressure use plain Mucinex. Saline nasal spray help and can safely be used as often as needed for congestion.  If you develop worsening sinus pain, fever or notice severe headache and vision changes, or if symptoms are not better after completion of antibiotic, please schedule an appointment with a health care provider.    Sinus infections are not as easily transmitted as other respiratory infection, however we still recommend that you avoid close contact with loved ones, especially the very young and elderly.  Remember to wash your hands thoroughly throughout the day as this is the number one way to prevent the spread of infection!  Home Care:  Only take medications as instructed by your medical team.  Complete the entire course of an antibiotic.  Do not take these medications with alcohol.  A steam or ultrasonic humidifier can help congestion.  You can place a towel over your head and breathe in the steam from hot water coming from a faucet.  Avoid close contacts especially the very young and the elderly.  Cover your mouth when you cough or sneeze.  Always remember to wash your  hands.  Get Help Right Away If:  You develop worsening fever or sinus pain.  You develop a severe head ache or visual changes.  Your symptoms persist after you have completed your treatment plan.  Make sure you  Understand these instructions.  Will watch your condition.  Will get help right away if you are not doing well or get worse.  Your e-visit answers were reviewed by a board certified advanced clinical practitioner to complete your personal care plan.  Depending on the condition, your plan could have included both over the counter or prescription medications.  If there is a problem please reply  once you have received a response from your provider.  Your safety is important to us.  If you have drug allergies check your prescription carefully.    You can use MyChart to ask questions about today's visit, request a non-urgent call back, or ask for a work or school excuse for 24 hours related to this e-Visit. If it has been greater than 24 hours you will need to follow up with your provider, or enter a new e-Visit to address those concerns.  You will get an e-mail in the next two days asking about your experience.  I hope that your e-visit has been valuable and will speed your recovery. Thank you for using e-visits.  .Marland Kitchen

## 2018-02-15 ENCOUNTER — Ambulatory Visit: Payer: Self-pay

## 2018-02-15 ENCOUNTER — Other Ambulatory Visit: Payer: Self-pay | Admitting: Occupational Medicine

## 2018-02-15 DIAGNOSIS — M79644 Pain in right finger(s): Secondary | ICD-10-CM

## 2018-03-01 ENCOUNTER — Telehealth: Payer: 59 | Admitting: Family

## 2018-03-01 DIAGNOSIS — J209 Acute bronchitis, unspecified: Secondary | ICD-10-CM | POA: Diagnosis not present

## 2018-03-01 DIAGNOSIS — J208 Acute bronchitis due to other specified organisms: Secondary | ICD-10-CM | POA: Diagnosis not present

## 2018-03-01 DIAGNOSIS — J0120 Acute ethmoidal sinusitis, unspecified: Secondary | ICD-10-CM | POA: Diagnosis not present

## 2018-03-01 MED ORDER — BENZONATATE 100 MG PO CAPS: 100.0000 mg | ORAL_CAPSULE | Freq: Three times a day (TID) | ORAL | 0 refills | Status: DC | PRN

## 2018-03-01 NOTE — Progress Notes (Signed)

## 2018-11-07 ENCOUNTER — Telehealth: Payer: 59 | Admitting: Physician Assistant

## 2018-11-07 DIAGNOSIS — J069 Acute upper respiratory infection, unspecified: Secondary | ICD-10-CM | POA: Diagnosis not present

## 2018-11-07 DIAGNOSIS — B9789 Other viral agents as the cause of diseases classified elsewhere: Secondary | ICD-10-CM

## 2018-11-07 MED ORDER — BENZONATATE 100 MG PO CAPS: 100.0000 mg | ORAL_CAPSULE | Freq: Three times a day (TID) | ORAL | 0 refills | Status: DC

## 2018-11-07 MED ORDER — IPRATROPIUM BROMIDE 0.06 % NA SOLN: 2.0000 | Freq: Four times a day (QID) | NASAL | 0 refills | Status: DC

## 2018-11-07 MED FILL — IPRATROPIUM 0.06% SPRAY: 0.06 | 30 days supply | Qty: 15 | Fill #0

## 2018-11-07 MED FILL — BENZONATATE 100 MG CAP: 100 | 5 days supply | Qty: 30 | Fill #0

## 2018-11-07 NOTE — Progress Notes (Signed)
Good Morning Alicia Beltran. We are sorry you are not feeling well.  Here is how we plan to help!  Based on what you have shared with me, it looks like you may have a viral upper respiratory infection.  I can say this with some confidence given that you report both upper respiratory manifestations along with cough Upper respiratory infections are caused by a large number of viruses; however, rhinovirus is the most common cause.  Symptoms vary from person to person, with common symptoms including sore throat, cough, and fatigue or lack of energy.  A low-grade fever of up to 100.4 may present, but is often uncommon.  Symptoms vary however, and are closely related to a person's age or underlying illnesses.  The most common symptoms associated with an upper respiratory infection are nasal discharge or congestion, cough, sneezing, headache and pressure in the ears and face.  These symptoms usually persist for about 3 to 10 days, but can last up to 2 weeks.  It is important to know that upper respiratory infections do not cause serious illness or complications in most cases.    Upper respiratory infections can be transmitted from person to person, with the most common method of transmission being a person's hands.  The virus is able to live on the skin and can infect other persons for up to 2 hours after direct contact.  Also, these can be transmitted when someone coughs or sneezes; thus, it is important to cover the mouth to reduce this risk.  To keep the spread of the illness at bay, good hand hygiene is very important.  This is an infection that is most likely caused by a virus. There are no specific treatments other than to help you with the symptoms until the infection runs its course.  We are sorry you are not feeling well.  Here is how we plan to help!   For nasal congestion, you may use an oral decongestants such as Mucinex D or if you have glaucoma or high blood pressure use plain Mucinex.  Saline nasal  spray or nasal drops can help and can safely be used as often as needed for congestion.  For your congestion, I have prescribed Ipratropium Bromide nasal spray 0.03% two sprays in each nostril 2-3 times a day  If you do not have a history of heart disease, hypertension, diabetes or thyroid disease, prostate/bladder issues or glaucoma, you may also use Sudafed to treat nasal congestion.  It is highly recommended that you consult with a pharmacist or your primary care physician to ensure this medication is safe for you to take.     If you have a cough, you may use cough suppressants such as Delsym and Robitussin.  If you have glaucoma or high blood pressure, you can also use Coricidin HBP.   For cough I have prescribed for you A prescription cough medication called Tessalon Perles 100 mg. You may take 1-2 capsules every 8 hours as needed for cough.  To decrease inflammation and help your overall congestion you may want to consider purchasing some Aleve D and taking as written on the package.  If you have a sore or scratchy throat, use a saltwater gargle-  to  teaspoon of salt dissolved in a 4-ounce to 8-ounce glass of warm water.  Gargle the solution for approximately 15-30 seconds and then spit.  It is important not to swallow the solution.  You can also use throat lozenges/cough drops and Chloraseptic spray to help  with throat pain or discomfort.  Warm or cold liquids can also be helpful in relieving throat pain.  For headache, pain or general discomfort, you can use Ibuprofen or Tylenol as directed.   Some authorities believe that zinc sprays or the use of Echinacea may shorten the course of your symptoms.   HOME CARE Only take medications as instructed by your medical team. Be sure to drink plenty of fluids. Water is fine as well as fruit juices, sodas and electrolyte beverages. You may want to stay away from caffeine or alcohol. If you are nauseated, try taking small sips of liquids. How do you  know if you are getting enough fluid? Your urine should be a pale yellow or almost colorless. Get rest. Taking a steamy shower or using a humidifier may help nasal congestion and ease sore throat pain. You can place a towel over your head and breathe in the steam from hot water coming from a faucet. Using a saline nasal spray works much the same way. Cough drops, hard candies and sore throat lozenges may ease your cough. Avoid close contacts especially the very young and the elderly Cover your mouth if you cough or sneeze Always remember to wash your hands.   GET HELP RIGHT AWAY IF: You develop worsening fever. If your symptoms do not improve within 10 days You develop yellow or green discharge from your nose over 3 days. You have coughing fits You develop a severe head ache or visual changes. You develop shortness of breath, difficulty breathing or start having chest pain  Your symptoms persist after you have completed your treatment plan  MAKE SURE YOU  Understand these instructions. Will watch your condition. Will get help right away if you are not doing well or get worse.  Your e-visit answers were reviewed by a board certified advanced clinical practitioner to complete your personal care plan. Depending upon the condition, your plan could have included both over the counter or prescription medications. Please review your pharmacy choice. If there is a problem, you may call our nursing hot line at and have the prescription routed to another pharmacy. Your safety is important to [place]. If you have drug allergies check your prescription carefully.   You can use MyChart to ask questions about today's visit, request a non-urgent call back, or ask for a work or school excuse for 24 hours related to this e-Visit. If it has been greater than 24 hours you will need to follow up with your provider, or enter a new e-Visit to address those concerns. You will get an e-mail in the next two days  asking about your experience.  I hope that your e-visit has been valuable and will speed your recovery. Thank you for using e-visits.      ===View-only below this line===   ----- Message -----    From: Alicia Beltran    Sent: 11/07/2018  8:28 AM EST      To: E-Visit Mailing List Subject: E-Visit Submission: Sore Throat  E-Visit Submission: Sore Throat --------------------------------  Question: Do you have any of the following symptoms (please select all that apply)? Answer:   Runny or congested nose            Cough or hoarseness            Pain in the throat when swallowing  Question: How long have you been having these symptoms? Answer:   4 days  Question: Do you have a fever? Answer:  No, I do not have a fever  Question: Are you in close contact with anyone who has similar symptoms ? Answer:   No  Question: Are you taking any over the counter medications for your symptoms? Answer:   Yes  Question: Please list the name(s) of the over the counter medications and whether they are helping or not: Answer:   Dayquil and Nyquil             There not helping  Question: Please list your medication allergies that you may have ? (If 'none' , please list as 'none') Answer:   None  Question: Please list any additional comments  Answer:     Question: Are you pregnant? Answer:   I am confident that I am not pregnant  Question: Are you breastfeeding? Answer:   No  A total of 5-10 minutes was spent evaluating this patients questionnaire and formulating a plan of care.

## 2018-11-11 DIAGNOSIS — R5383 Other fatigue: Secondary | ICD-10-CM | POA: Diagnosis not present

## 2018-11-11 DIAGNOSIS — J0140 Acute pansinusitis, unspecified: Secondary | ICD-10-CM | POA: Diagnosis not present

## 2018-11-11 DIAGNOSIS — R030 Elevated blood-pressure reading, without diagnosis of hypertension: Secondary | ICD-10-CM | POA: Diagnosis not present

## 2018-11-17 DIAGNOSIS — R509 Fever, unspecified: Secondary | ICD-10-CM | POA: Diagnosis not present

## 2018-11-17 DIAGNOSIS — J0100 Acute maxillary sinusitis, unspecified: Secondary | ICD-10-CM | POA: Diagnosis not present

## 2018-11-17 DIAGNOSIS — J111 Influenza due to unidentified influenza virus with other respiratory manifestations: Secondary | ICD-10-CM | POA: Diagnosis not present

## 2019-04-24 MED FILL — predniSONE 5 MG TABS: 5 | 12 days supply | Qty: 48 | Fill #0

## 2019-05-15 ENCOUNTER — Telehealth: Payer: PRIVATE HEALTH INSURANCE | Admitting: Physician Assistant

## 2019-05-15 DIAGNOSIS — M5442 Lumbago with sciatica, left side: Secondary | ICD-10-CM

## 2019-05-15 NOTE — Progress Notes (Signed)
Based on what you shared with me, I feel your condition warrants further evaluation and I recommend that you be seen for a face to face office visit.  Given you are having severe back pain with numbness/weakness going down the leg, this could indicate a more serious condition requiring face to face evaluation that I cannot provide through a virtual visit. I would recommend seeking evaluation today with your primary care provider or an urgent care as detailed below.   NOTE: If you entered your credit card information for this eVisit, you will not be charged. You may see a "hold" on your card for the $35 but that hold will drop off and you will not have a charge processed.  If you are having a true medical emergency please call 911.     For an urgent face to face visit, [place] has four urgent care centers for your convenience:   . [facility]    [phone]                  Get Driving Directions  [address], [place] [address] . 10 am to 8 pm Monday-Friday . 12 pm to 8 pm Saturday-Sunday   . [facility] at [place]                  Get Driving Directions  [address], [place], [place] [address] . 8 am to 8 pm Monday-Friday . 9 am to 6 pm Saturday . 11 am to 6 pm Sunday   . [facility] at [place]                  Get Driving Directions   [address] [place], [place] [address] . 8 am to 8 pm Monday-Friday . 8 am to 4 pm Saturday-Sunday    . [facility] at [place]                    Get Driving Directions  [phone]  [address] [place], [place] [address]  . Monday-Friday, 12 PM to 6 PM    Your e-visit answers were reviewed by a board certified advanced clinical practitioner to complete your personal care plan.  Thank you for using e-Visits.  Greater than 5 minutes, yet less than 10 minutes of time have  been spent researching, coordinating, and implementing care for this patient today.

## 2019-05-30 DIAGNOSIS — M5416 Radiculopathy, lumbar region: Secondary | ICD-10-CM | POA: Diagnosis not present

## 2019-05-30 DIAGNOSIS — M545 Low back pain: Secondary | ICD-10-CM | POA: Diagnosis not present

## 2019-06-11 DIAGNOSIS — M5416 Radiculopathy, lumbar region: Secondary | ICD-10-CM | POA: Diagnosis not present

## 2019-06-22 DIAGNOSIS — M5416 Radiculopathy, lumbar region: Secondary | ICD-10-CM | POA: Diagnosis not present

## 2019-07-16 DIAGNOSIS — M5416 Radiculopathy, lumbar region: Secondary | ICD-10-CM | POA: Diagnosis not present

## 2019-08-15 DIAGNOSIS — M545 Low back pain: Secondary | ICD-10-CM | POA: Diagnosis not present

## 2019-08-15 DIAGNOSIS — M5416 Radiculopathy, lumbar region: Secondary | ICD-10-CM | POA: Diagnosis not present

## 2019-08-15 MED FILL — DICLOFENAC SODIUM 75 MG TAB: 75 | 30 days supply | Qty: 60 | Fill #0

## 2019-08-15 MED FILL — predniSONE 10 MG (48) TBPK: 10 | 12 days supply | Qty: 48 | Fill #0

## 2019-08-15 MED FILL — METHOCARBAMOL 500 MG TABS: 500 | 15 days supply | Qty: 60 | Fill #0

## 2019-08-23 DIAGNOSIS — M545 Low back pain: Secondary | ICD-10-CM | POA: Diagnosis not present

## 2019-08-27 DIAGNOSIS — M545 Low back pain: Secondary | ICD-10-CM | POA: Diagnosis not present

## 2019-08-27 DIAGNOSIS — M5416 Radiculopathy, lumbar region: Secondary | ICD-10-CM | POA: Diagnosis not present

## 2019-08-27 MED FILL — GABAPENTIN 300 MG CAPSULE: 300 | 30 days supply | Qty: 60 | Fill #0

## 2019-08-28 ENCOUNTER — Ambulatory Visit: Payer: Self-pay | Admitting: Orthopedic Surgery

## 2019-08-28 NOTE — H&P (Signed)
Subjective:   For associated symptoms, patient reports numbness (right leg into the toes) and tingling but reports no weakness and no change in bowel/bladder habits. For location, she reports right (leg and calf pain). For quality, she reports constant and worsening. For duration, she reports 5 months. For alleviating factors, she reports nothing helps. For previous surgery, she reports none. For prior imaging, she reports mri (eo 08-23-19).  Past Surgical History:  Procedure Laterality Date  . LAPAROSCOPY ABDOMEN DIAGNOSTIC      Current Outpatient Medications  Medication Sig Dispense Refill Last Dose  . benzonatate (TESSALON) 100 MG capsule Take 1-2 capsules (100-200 mg total) by mouth 3 (three) times daily. 30 capsule 0   . cephALEXin (KEFLEX) 500 MG capsule Take 1 capsule (500 mg total) by mouth 2 (two) times daily. 14 capsule 0   . ipratropium (ATROVENT) 0.06 % nasal spray Place 2 sprays into both nostrils 4 (four) times daily. 15 mL 0    No current facility-administered medications for this visit.   No Known Allergies  Social History   Tobacco Use  . Smoking status: Never Smoker  Substance Use Topics  . Alcohol use: Yes    Comment: occ    No family history on file.  Review of Systems As stated in HPI  Objective:   Clinical exam: Alicia Beltran is a pleasant individual, who appears younger than their stated age. She Is alert and orientated 3. No shortness of breath, chest pain. Abdomen is soft and non-tender, negative loss of bowel and bladder control, no rebound tenderness. Negative: skin lesions abrasions contusions  Lungs: Clear to auscultation bilaterally  Cardiac: No rubs gallops murmurs, regular rate and rhythm  Peripheral pulses: 2+ dorsalis pedis/posterior tibialis pulses bilaterally. Compartment soft and nontender.  Gait pattern: Ataxic gait pattern due to severe radicular right leg pain  Assistive devices: None  Neuro: 4/5 right gastrocnemius strength. Remainder of  the lower extremity is 5/5 motor strength. Positive numbness and dysesthesias as well as radicular pain in the right S1 dermatome. Positive straight leg raise test on the right side. Positive contralateral straight leg raise test.   Musculoskeletal: No significant back pain with palpation or range of motion. Negative Patrick's test. No hip, knee, ankle pain with isolated joint range of motion  MRI of the lumbar spine dated 08/23/19: Large right disc herniation L5-S1 with marked compression and mass-effect on the right S1 nerve root. No other significant findings are noted on the MRI.  X-rays of the lumbar spine are unremarkable. No spondylolisthesis or scoliosis is noted. No significant collapse of the disc spaces are noted.  Assessment:    Alicia Beltran is a very pleasant active 33 year old woman with a 2 week history of severe debilitating radicular right leg pain. Patient states this came on quite suddenly and she is been unable to work because of the pain. She has had a steroid Dosepak with little to no significant improvement. She states the pain in the leg is debilitating. Clinical exam is consistent with both motor and sensory deficits in the S1 dermatome. Positive nerve root tension signs with reproduction of radicular S1 pain. MRI confirms a very large disc herniation causing marked compression of the S1 nerve root.   Plan:   I have gone over the images and pathology with the patient in great detail. At this point we discussed treatment options and she is elected to move forward with surgery. I have gone over the risks and benefits as well as alternatives and all of  her questions were encouraged and addressed.      Risks and benefits of decompression/discectomy: Infection, bleeding, death, stroke, paralysis, ongoing or worse pain, need for additional surgery, leak of spinal fluid, adjacent segment degeneration requiring additional surgery, post-operative hematoma formation that can result in  neurological compromise and the need for urgent/emergent re-operation. Loss in bowel and bladder control. Injury to major vessels that could result in the need for urgent abdominal surgery to stop bleeding. Risk of deep venous thrombosis (DVT) and the need for additional treatment. Recurrent disc herniation resulting in the need for revision surgery, which could include fusion surgery (utilizing instrumentation such as pedicle screws and intervertebral cages).   Additional risk: If instrumentation is used there is a risk of migration, or breakage of that hardware that could require additional surgery.      Treatment plan: Surgical intervention: L5-S1 right microdiscectomy.

## 2019-09-03 NOTE — Pre-Procedure Instructions (Signed)
[facility] - [place], [place] - [address] [place] [address] [place] [address] Phone: [phone] Fax: [phone]    Your procedure is scheduled on Wed., Dec. 30, 2020 from 2:05PM-4:44PM  Report to [facility] Entrance "A" at 12:05PM  Call this number if you have problems the morning of surgery:  [phone]   Remember:  Do not eat or drink after midnight on Dec. 29th    Take these medicines the morning of surgery with A SIP OF WATER: Gabapentin (NEURONTIN)    If Needed: Acetaminophen (TYLENOL)  As of today, stop taking all Aspirin (unless instructed by your doctor) and Other Aspirin containing products, Vitamins, Fish oils, and Herbal medications. Also stop all NSAIDS i.e. Advil, Ibuprofen, Motrin, Aleve, Anaprox, Naproxen, BC, Goody Powders, and all Supplements.   No Smoking of any kind, Tobacco, or Alcohol products 24 hours prior to your procedure. If you use a Cpap at night, you may bring the machine and all equipment for your overnight stay.   Special instructions:   [place]- Preparing For Surgery  Before surgery, you can play an important role. Because skin is not sterile, your skin needs to be as free of germs as possible. You can reduce the number of germs on your skin by washing with CHG (chlorahexidine gluconate) Soap before surgery.  CHG is an antiseptic cleaner which kills germs and bonds with the skin to continue killing germs even after washing.    Please do not use if you have an allergy to CHG or antibacterial soaps. If your skin becomes reddened/irritated stop using the CHG.  Do not shave (including legs and underarms) for at least 48 hours prior to first CHG shower. It is OK to shave your face.  Please follow these instructions carefully.   1. Shower the NIGHT BEFORE SURGERY and the MORNING OF SURGERY with CHG.   2. If you chose to wash your hair, wash your hair first as usual with your normal  shampoo.  3. After you shampoo, rinse your hair and body thoroughly to remove the shampoo.  4. Use CHG as you would any other liquid soap. You can apply CHG directly to the skin and wash gently with a scrungie or a clean washcloth.   5. Apply the CHG Soap to your body ONLY FROM THE NECK DOWN.  Do not use on open wounds or open sores. Avoid contact with your eyes, ears, mouth and genitals (private parts). Wash Face and genitals (private parts)  with your normal soap.  6. Wash thoroughly, paying special attention to the area where your surgery will be performed.  7. Thoroughly rinse your body with warm water from the neck down.  8. DO NOT shower/wash with your normal soap after using and rinsing off the CHG Soap.  9. Pat yourself dry with a CLEAN TOWEL.  10. Wear CLEAN PAJAMAS to bed the night before surgery, wear comfortable clothes the morning of surgery  11. Place CLEAN SHEETS on your bed the night of your first shower and DO NOT SLEEP WITH PETS.   Day of Surgery:           Remember to brush your teeth WITH YOUR REGULAR TOOTHPASTE.  Do not wear jewelry, make-up or nail polish.  Do not wear lotions, powders, or perfumes, or deodorant.  Do not shave 48 hours prior to surgery.    Do not bring valuables to the hospital.  [facility] is not responsible for any belongings  or valuables.  Contacts, dentures or bridgework may not be worn into surgery.   For patients admitted to the hospital, discharge time will be determined by your treatment team.  Patients discharged the day of surgery will not be allowed to drive home, and someone age 53 and over needs to stay with them for 24 hours.  Please wear clean clothes to the hospital/surgery center.    Please read over the following fact sheets that you were given.

## 2019-09-04 ENCOUNTER — Encounter (HOSPITAL_COMMUNITY): Payer: Self-pay

## 2019-09-04 ENCOUNTER — Other Ambulatory Visit: Payer: Self-pay

## 2019-09-04 ENCOUNTER — Encounter (HOSPITAL_COMMUNITY)
Admission: RE | Admit: 2019-09-04 | Discharge: 2019-09-04 | Disposition: A | Discharge status: Home / Self Care | Payer: 59 | Source: Ambulatory Visit | Attending: Orthopedic Surgery | Admitting: Orthopedic Surgery

## 2019-09-04 ENCOUNTER — Ambulatory Visit (HOSPITAL_COMMUNITY)
Admission: RE | Admit: 2019-09-04 | Discharge: 2019-09-04 | Disposition: A | Discharge status: Home / Self Care | Payer: 59 | Source: Ambulatory Visit | Attending: Orthopedic Surgery | Admitting: Orthopedic Surgery

## 2019-09-04 ENCOUNTER — Other Ambulatory Visit (HOSPITAL_COMMUNITY)
Admission: RE | Admit: 2019-09-04 | Discharge: 2019-09-04 | Disposition: A | Discharge status: Home / Self Care | Payer: 59 | Source: Ambulatory Visit | Attending: Orthopedic Surgery | Admitting: Orthopedic Surgery

## 2019-09-04 DIAGNOSIS — Z01818 Encounter for other preprocedural examination: Secondary | ICD-10-CM

## 2019-09-04 HISTORY — DX: Headache, unspecified: R51.9

## 2019-09-04 LAB — BASIC METABOLIC PANEL
Anion gap: 6 (ref 5–15)
BUN: 11 mg/dL (ref 6–20)
CO2: 25 mmol/L (ref 22–32)
Calcium: 9.5 mg/dL (ref 8.9–10.3)
Chloride: 107 mmol/L (ref 98–111)
Creatinine, Ser: 0.67 mg/dL (ref 0.44–1.00)
GFR calc Af Amer: >60 mL/min (ref >60)
GFR calc non Af Amer: >60 mL/min (ref >60)
Glucose, Bld: 86 mg/dL (ref 70–99)
Potassium: 3.8 mmol/L (ref 3.5–5.1)
Sodium: 138 mmol/L (ref 135–145)

## 2019-09-04 LAB — URINALYSIS, ROUTINE W REFLEX MICROSCOPIC
Bilirubin Urine: NEGATIVE
Glucose, UA: NEGATIVE mg/dL
Ketones, ur: NEGATIVE mg/dL
Nitrite: NEGATIVE
Protein, ur: NEGATIVE mg/dL
Specific Gravity, Urine: 1.006 (ref 1.005–1.030)
pH: 6 (ref 5.0–8.0)

## 2019-09-04 LAB — APTT: aPTT: 30 seconds (ref 24–36)

## 2019-09-04 LAB — PROTIME-INR
INR: 0.9 (ref 0.8–1.2)
Prothrombin Time: 12.4 seconds (ref 11.4–15.2)

## 2019-09-04 LAB — SURGICAL PCR SCREEN
MRSA, PCR: NEGATIVE
Staphylococcus aureus: NEGATIVE

## 2019-09-04 LAB — CBC
HCT: 42.8 % (ref 36.0–46.0)
Hemoglobin: 14 g/dL (ref 12.0–15.0)
MCH: 30 pg (ref 26.0–34.0)
MCHC: 32.7 g/dL (ref 30.0–36.0)
MCV: 91.8 fL (ref 80.0–100.0)
Platelets: 317 10*3/uL (ref 150–400)
RBC: 4.66 MIL/uL (ref 3.87–5.11)
RDW: 12.2 % (ref 11.5–15.5)
WBC: 8.4 10*3/uL (ref 4.0–10.5)
nRBC: 0 % (ref 0.0–0.2)

## 2019-09-04 LAB — SARS CORONAVIRUS 2 (TAT 6-24 HRS): SARS Coronavirus 2: NEGATIVE

## 2019-09-04 MED FILL — SULFAMETHOXAZOLE-TMP DS TAB: 800-160 | 1 days supply | Qty: 2 | Fill #0

## 2019-09-04 NOTE — Progress Notes (Signed)
Anesthesia Chart Review:  Case: [id number] Date/Time: 09/05/19 1024   Procedure: Right L5-S1 disectomy SAME DAY DISCHARGE (Right ) - 2.5 hrs   Anesthesia type: General   Pre-op diagnosis: Right L5-S1 herniated disc with radiculopathy   Location: [facility] OR ROOM 04 / [facility]   Surgeons: Melina Schools, MD      DISCUSSION: Patient is a 33 year old female scheduled for the above procedure.  History includes never smoker and headaches.  09/04/19 COVID-19 test in process. UA showed large leukocytes, but negative nitrites which was called to [place] at Dr. Rolena Infante' office, so she can forward to him or his APP for review. She needs a urine pregnancy test on the day of surgery.    VS: BP 138/73   Pulse 91   Temp 37.1 C   Resp 17   Ht 5\' 7"  (1.702 m)   Wt 85.2 kg   LMP 08/06/2019 (Approximate)   SpO2 100%   BMI 29.43 kg/m   PROVIDERS: Patient, No Pcp Per   LABS: Preoperative labs noted. UA showed large leukocytes, negative nitrites.  (all labs ordered are listed, but only abnormal results are displayed)  Labs Reviewed  URINALYSIS, ROUTINE W REFLEX MICROSCOPIC - Abnormal; Notable for the following components:      Result Value   APPearance HAZY (*)    Hgb urine dipstick SMALL (*)    Leukocytes,Ua LARGE (*)    Bacteria, UA FEW (*)    All other components within normal limits  SURGICAL PCR SCREEN  APTT  BASIC METABOLIC PANEL  CBC  PROTIME-INR     IMAGES: CXR 09/04/19: IMPRESSION: Normal study.   EKG: N/A (NSR on 02/09/13)   CV: N/A   Past Medical History:  Diagnosis Date  . Headache     Past Surgical History:  Procedure Laterality Date  . LAPAROSCOPY ABDOMEN DIAGNOSTIC      MEDICATIONS: . acetaminophen (TYLENOL) 500 MG tablet  . diphenhydrAMINE (BENADRYL) 25 MG tablet  . gabapentin (NEURONTIN) 300 MG capsule  . ibuprofen (ADVIL) 200 MG tablet  . methocarbamol (ROBAXIN) 500 MG tablet  . Multiple Vitamins-Minerals (EMERGEN-C IMMUNE PLUS PO)   No current  facility-administered medications for this encounter.    Myra Gianotti, PA-C Surgical Short Stay/Anesthesiology [facility] Phone [phone] [facility] Phone [phone] 09/04/2019 4:00 PM

## 2019-09-04 NOTE — Progress Notes (Signed)
Abnormal lab, Dr. Rolena Infante made aware via [place].

## 2019-09-04 NOTE — Anesthesia Preprocedure Evaluation (Addendum)
Anesthesia Evaluation  Patient identified by MRN, date of birth, ID band Patient awake    Reviewed: Allergy & Precautions, NPO status , Patient's Chart, lab work & pertinent test results  History of Anesthesia Complications Negative for: history of anesthetic complications  Airway Mallampati: II  TM Distance: >3 FB Neck ROM: Full    Dental  (+) Missing, Dental Advisory Given,    Pulmonary neg pulmonary ROS,    Pulmonary exam normal        Cardiovascular negative cardio ROS Normal cardiovascular exam     Neuro/Psych  Headaches, Right L5-S1 herniated disc with radiculopathy negative psych ROS   GI/Hepatic negative GI ROS, Neg liver ROS,   Endo/Other  negative endocrine ROS  Renal/GU negative Renal ROS  negative genitourinary   Musculoskeletal negative musculoskeletal ROS (+)   Abdominal   Peds  Hematology negative hematology ROS (+)   Anesthesia Other Findings Day of surgery medications reviewed with patient.  Reproductive/Obstetrics negative OB ROS                           Anesthesia Physical Anesthesia Plan  ASA: I  Anesthesia Plan: General   Post-op Pain Management:    Induction: Intravenous  PONV Risk Score and Plan: 3 and Treatment may vary due to age or medical condition, Ondansetron, Dexamethasone, Midazolam and Scopolamine patch - Pre-op  Airway Management Planned: Oral ETT  Additional Equipment: None  Intra-op Plan:   Post-operative Plan: Extubation in OR  Informed Consent: I have reviewed the patients History and Physical, chart, labs and discussed the procedure including the risks, benefits and alternatives for the proposed anesthesia with the patient or authorized representative who has indicated his/her understanding and acceptance.     Dental advisory given  Plan Discussed with: CRNA  Anesthesia Plan Comments: (PAT note written 09/04/2019 by Myra Gianotti, PA-C. )      Anesthesia Quick Evaluation

## 2019-09-04 NOTE — Progress Notes (Signed)
PCP - denies Cardiologist - denies  PPM/ICD - denies Device Orders - N/A Rep Notified - N/A  Chest x-ray - 09/04/2019 EKG - N/A Stress Test - denies ECHO - denies Cardiac Cath - denies  Sleep Study - denies CPAP - N/A  Blood Thinner Instructions: N/A Aspirin Instructions: N/A  ERAS Protcol - No PRE-SURGERY Ensure or G2- N/A  COVID TEST- Scheduled for today 09/04/2019 after PAT appointment. Patient verbalized understanding self-quarantine instructions, appointment time and place  Anesthesia review: YES, per MD order  Patient denies shortness of breath, fever, cough and chest pain at PAT appointment  All instructions explained to the patient, with a verbal understanding of the material. Patient agrees to go over the instructions while at home for a better understanding. Patient also instructed to self quarantine after being tested for COVID-19. The opportunity to ask questions was provided.

## 2019-09-04 NOTE — Pre-Procedure Instructions (Signed)
[place], [place] - [place] [address] Phone: [phone] Fax: [phone]   Your procedure is scheduled on Wednesday, December 30th  Report to [facility] Entrance "A" at 8:30 A.M.  Call this number if you have problems the morning of surgery:  [phone]   Remember:  Do not eat or drink after midnight on Dec. 29th    Take these medicines the morning of surgery with A SIP OF WATER: Gabapentin (NEURONTIN)    If Needed: Acetaminophen (TYLENOL)  As of today, stop taking all Aspirin (unless instructed by your doctor) and Other Aspirin containing products, Vitamins, Fish oils, and Herbal medications. Also stop all NSAIDS i.e. Advil, Ibuprofen, Motrin, Aleve, Anaprox, Naproxen, BC, Goody Powders, and all Supplements.   No Smoking of any kind, Tobacco, or Alcohol products 24 hours prior to your procedure. If you use a Cpap at night, you may bring the machine and all equipment for your overnight stay.  Special instructions:   [facility]- Preparing For Surgery  Before surgery, you can play an important role. Because skin is not sterile, your skin needs to be as free of germs as possible. You can reduce the number of germs on your skin by washing with CHG (chlorahexidine gluconate) Soap before surgery.  CHG is an antiseptic cleaner which kills germs and bonds with the skin to continue killing germs even after washing.    Please do not use if you have an allergy to CHG or antibacterial soaps. If your skin becomes reddened/irritated stop using the CHG.  Do not shave (including legs and underarms) for at least 48 hours prior to first CHG shower. It is OK to shave your face.  Please follow these instructions carefully.   1. Shower the NIGHT BEFORE SURGERY and the MORNING OF SURGERY with CHG.   2. If you chose to wash your hair, wash your hair first as usual with your normal shampoo.  3. After you  shampoo, rinse your hair and body thoroughly to remove the shampoo.  4. Use CHG as you would any other liquid soap. You can apply CHG directly to the skin and wash gently with a scrungie or a clean washcloth.   5. Apply the CHG Soap to your body ONLY FROM THE NECK DOWN.  Do not use on open wounds or open sores. Avoid contact with your eyes, ears, mouth and genitals (private parts). Wash Face and genitals (private parts)  with your normal soap.  6. Wash thoroughly, paying special attention to the area where your surgery will be performed.  7. Thoroughly rinse your body with warm water from the neck down.  8. DO NOT shower/wash with your normal soap after using and rinsing off the CHG Soap.  9. Pat yourself dry with a CLEAN TOWEL.  10. Wear CLEAN PAJAMAS to bed the night before surgery, wear comfortable clothes the morning of surgery  11. Place CLEAN SHEETS on your bed the night of your first shower and DO NOT SLEEP WITH PETS.  Day of Surgery:           Remember to brush your teeth WITH YOUR REGULAR TOOTHPASTE.  Do not wear jewelry, make-up or nail polish.  Do not wear lotions, powders, or perfumes, or deodorant.  Do not shave 48 hours prior to surgery.    Do not bring valuables to the hospital.  [facility] is not responsible for any belongings or valuables.  Contacts, dentures  or bridgework may not be worn into surgery.   For patients admitted to the hospital, discharge time will be determined by your treatment team.  Patients discharged the day of surgery will not be allowed to drive home, and someone age 49 and over needs to stay with them for 24 hours.  Please wear clean clothes to the hospital/surgery center.    Please read over the following fact sheets that you were given.

## 2019-09-05 ENCOUNTER — Encounter (HOSPITAL_COMMUNITY): Payer: Self-pay | Admitting: Orthopedic Surgery

## 2019-09-05 ENCOUNTER — Ambulatory Visit (HOSPITAL_COMMUNITY): Payer: 59

## 2019-09-05 ENCOUNTER — Other Ambulatory Visit: Payer: Self-pay

## 2019-09-05 ENCOUNTER — Ambulatory Visit (HOSPITAL_COMMUNITY): Payer: 59 | Admitting: Anesthesiology

## 2019-09-05 ENCOUNTER — Ambulatory Visit (HOSPITAL_COMMUNITY)
Admission: RE | Disposition: A | Discharge status: Home / Self Care | Payer: Self-pay | Source: Home / Self Care | Attending: Orthopedic Surgery

## 2019-09-05 ENCOUNTER — Ambulatory Visit (HOSPITAL_COMMUNITY)
Admission: RE | Admit: 2019-09-05 | Discharge: 2019-09-05 | Disposition: A | Discharge status: Home / Self Care | Payer: 59 | Attending: Orthopedic Surgery | Admitting: Orthopedic Surgery

## 2019-09-05 ENCOUNTER — Ambulatory Visit (HOSPITAL_COMMUNITY): Payer: 59 | Admitting: Vascular Surgery

## 2019-09-05 DIAGNOSIS — M5117 Intervertebral disc disorders with radiculopathy, lumbosacral region: Secondary | ICD-10-CM | POA: Insufficient documentation

## 2019-09-05 DIAGNOSIS — Z419 Encounter for procedure for purposes other than remedying health state, unspecified: Secondary | ICD-10-CM

## 2019-09-05 DIAGNOSIS — Z79899 Other long term (current) drug therapy: Secondary | ICD-10-CM | POA: Diagnosis not present

## 2019-09-05 DIAGNOSIS — Z981 Arthrodesis status: Secondary | ICD-10-CM | POA: Diagnosis not present

## 2019-09-05 DIAGNOSIS — M5127 Other intervertebral disc displacement, lumbosacral region: Secondary | ICD-10-CM | POA: Diagnosis present

## 2019-09-05 DIAGNOSIS — M5416 Radiculopathy, lumbar region: Secondary | ICD-10-CM | POA: Diagnosis not present

## 2019-09-05 HISTORY — PX: LUMBAR LAMINECTOMY/DECOMPRESSION MICRODISCECTOMY: SHX5026

## 2019-09-05 LAB — POCT PREGNANCY, URINE: Preg Test, Ur: NEGATIVE

## 2019-09-05 SURGERY — LUMBAR LAMINECTOMY/DECOMPRESSION MICRODISCECTOMY 1 LEVEL
Anesthesia: General | Site: Spine Lumbar | Laterality: Right

## 2019-09-05 MED ORDER — MIDAZOLAM HCL 5 MG/5ML IJ SOLN
INTRAMUSCULAR | Status: DC | PRN
  Administered 2019-09-05: 2 mg via INTRAVENOUS

## 2019-09-05 MED ORDER — FENTANYL CITRATE (PF) 100 MCG/2ML IJ SOLN
INTRAMUSCULAR | Status: DC | PRN
  Administered 2019-09-05: 125 ug via INTRAVENOUS
  Administered 2019-09-05: 50 ug via INTRAVENOUS
  Administered 2019-09-05: 75 ug via INTRAVENOUS

## 2019-09-05 MED ORDER — CEFAZOLIN SODIUM-DEXTROSE 2-4 GM/100ML-% IV SOLN
2.0000 g | INTRAVENOUS | Status: AC
  Administered 2019-09-05: 2 g via INTRAVENOUS
  Filled 2019-09-05: qty 100

## 2019-09-05 MED ORDER — LACTATED RINGERS IV SOLN: INTRAVENOUS | Status: DC

## 2019-09-05 MED ORDER — OXYCODONE-ACETAMINOPHEN 10-325 MG PO TABS: 1.0000 | ORAL_TABLET | Freq: Four times a day (QID) | ORAL | 0 refills | Status: AC | PRN

## 2019-09-05 MED ORDER — ACETAMINOPHEN 650 MG RE SUPP: 650.0000 mg | RECTAL | Status: DC | PRN

## 2019-09-05 MED ORDER — PROPOFOL 10 MG/ML IV BOLUS
INTRAVENOUS | Status: DC | PRN
  Administered 2019-09-05: 120 mg via INTRAVENOUS
  Administered 2019-09-05: 30 mg via INTRAVENOUS

## 2019-09-05 MED ORDER — DEXAMETHASONE SODIUM PHOSPHATE 4 MG/ML IJ SOLN
INTRAMUSCULAR | Status: DC | PRN
  Administered 2019-09-05: 4 mg via INTRAVENOUS

## 2019-09-05 MED ORDER — ONDANSETRON HCL 4 MG/2ML IJ SOLN
INTRAMUSCULAR | Status: AC
  Filled 2019-09-05: qty 2

## 2019-09-05 MED ORDER — PHENYLEPHRINE HCL-NACL 10-0.9 MG/250ML-% IV SOLN
INTRAVENOUS | Status: DC | PRN
  Administered 2019-09-05: 25 ug/min via INTRAVENOUS

## 2019-09-05 MED ORDER — ROCURONIUM BROMIDE 10 MG/ML (PF) SYRINGE
PREFILLED_SYRINGE | INTRAVENOUS | Status: AC
  Filled 2019-09-05: qty 10

## 2019-09-05 MED ORDER — FENTANYL CITRATE (PF) 100 MCG/2ML IJ SOLN
INTRAMUSCULAR | Status: AC
  Filled 2019-09-05: qty 2

## 2019-09-05 MED ORDER — THROMBIN (RECOMBINANT) 20000 UNITS EX SOLR
CUTANEOUS | Status: AC
  Filled 2019-09-05: qty 20000

## 2019-09-05 MED ORDER — METHYLPREDNISOLONE ACETATE 40 MG/ML IJ SUSP
INTRAMUSCULAR | Status: DC | PRN
  Administered 2019-09-05: 40 mg

## 2019-09-05 MED ORDER — METHYLPREDNISOLONE ACETATE 40 MG/ML IJ SUSP
INTRAMUSCULAR | Status: AC
  Filled 2019-09-05: qty 1

## 2019-09-05 MED ORDER — LIDOCAINE 2% (20 MG/ML) 5 ML SYRINGE
INTRAMUSCULAR | Status: DC | PRN
  Administered 2019-09-05: 40 mg via INTRAVENOUS

## 2019-09-05 MED ORDER — KETOROLAC TROMETHAMINE 30 MG/ML IJ SOLN
30.0000 mg | Freq: Once | INTRAMUSCULAR | Status: AC | PRN
  Administered 2019-09-05: 30 mg via INTRAVENOUS

## 2019-09-05 MED ORDER — ACETAMINOPHEN 325 MG PO TABS: 650.0000 mg | ORAL_TABLET | ORAL | Status: DC | PRN

## 2019-09-05 MED ORDER — THROMBIN 20000 UNITS EX SOLR
CUTANEOUS | Status: DC | PRN
  Administered 2019-09-05: 20 mL

## 2019-09-05 MED ORDER — METHOCARBAMOL 1000 MG/10ML IJ SOLN: 500.0000 mg | Freq: Four times a day (QID) | INTRAVENOUS | Status: DC | PRN

## 2019-09-05 MED ORDER — KETOROLAC TROMETHAMINE 30 MG/ML IJ SOLN
INTRAMUSCULAR | Status: AC
  Filled 2019-09-05: qty 1

## 2019-09-05 MED ORDER — FENTANYL CITRATE (PF) 250 MCG/5ML IJ SOLN
INTRAMUSCULAR | Status: AC
  Filled 2019-09-05: qty 5

## 2019-09-05 MED ORDER — PROPOFOL 10 MG/ML IV BOLUS
INTRAVENOUS | Status: AC
  Filled 2019-09-05: qty 20

## 2019-09-05 MED ORDER — METHOCARBAMOL 500 MG PO TABS
500.0000 mg | ORAL_TABLET | Freq: Four times a day (QID) | ORAL | Status: DC | PRN
  Administered 2019-09-05: 500 mg via ORAL

## 2019-09-05 MED ORDER — MENTHOL 3 MG MT LOZG: 1.0000 | LOZENGE | OROMUCOSAL | Status: DC | PRN

## 2019-09-05 MED ORDER — CEFAZOLIN SODIUM-DEXTROSE 1-4 GM/50ML-% IV SOLN: 1.0000 g | Freq: Three times a day (TID) | INTRAVENOUS | Status: DC

## 2019-09-05 MED ORDER — ACETAMINOPHEN 500 MG PO TABS
1000.0000 mg | ORAL_TABLET | Freq: Once | ORAL | Status: AC
  Administered 2019-09-05: 1000 mg via ORAL
  Filled 2019-09-05: qty 2

## 2019-09-05 MED ORDER — ROCURONIUM BROMIDE 10 MG/ML (PF) SYRINGE
PREFILLED_SYRINGE | INTRAVENOUS | Status: DC | PRN
  Administered 2019-09-05: 40 mg via INTRAVENOUS
  Administered 2019-09-05: 20 mg via INTRAVENOUS

## 2019-09-05 MED ORDER — HEMOSTATIC AGENTS (NO CHARGE) OPTIME
TOPICAL | Status: DC | PRN
  Administered 2019-09-05: 1

## 2019-09-05 MED ORDER — OXYCODONE HCL 5 MG PO TABS
5.0000 mg | ORAL_TABLET | Freq: Once | ORAL | Status: AC | PRN
  Administered 2019-09-05: 14:00:00 5 mg via ORAL

## 2019-09-05 MED ORDER — ONDANSETRON HCL 4 MG/2ML IJ SOLN: 4.0000 mg | Freq: Four times a day (QID) | INTRAMUSCULAR | Status: DC | PRN

## 2019-09-05 MED ORDER — SCOPOLAMINE 1 MG/3DAYS TD PT72
1.0000 | MEDICATED_PATCH | Freq: Once | TRANSDERMAL | Status: DC
  Administered 2019-09-05: 09:00:00 1.5 mg via TRANSDERMAL
  Filled 2019-09-05: qty 1

## 2019-09-05 MED ORDER — SODIUM CHLORIDE 0.9% FLUSH: 3.0000 mL | Freq: Two times a day (BID) | INTRAVENOUS | Status: DC

## 2019-09-05 MED ORDER — FENTANYL CITRATE (PF) 100 MCG/2ML IJ SOLN
25.0000 ug | INTRAMUSCULAR | Status: DC | PRN
  Administered 2019-09-05 (×2): 50 ug via INTRAVENOUS

## 2019-09-05 MED ORDER — ONDANSETRON HCL 4 MG/2ML IJ SOLN
INTRAMUSCULAR | Status: DC | PRN
  Administered 2019-09-05: 4 mg via INTRAVENOUS

## 2019-09-05 MED ORDER — MIDAZOLAM HCL 2 MG/2ML IJ SOLN
INTRAMUSCULAR | Status: AC
  Filled 2019-09-05: qty 2

## 2019-09-05 MED ORDER — OXYCODONE HCL 5 MG PO TABS: 5.0000 mg | ORAL_TABLET | ORAL | Status: DC | PRN

## 2019-09-05 MED ORDER — PROMETHAZINE HCL 25 MG/ML IJ SOLN: 6.2500 mg | INTRAMUSCULAR | Status: DC | PRN

## 2019-09-05 MED ORDER — 0.9 % SODIUM CHLORIDE (POUR BTL) OPTIME
TOPICAL | Status: DC | PRN
  Administered 2019-09-05: 1000 mL

## 2019-09-05 MED ORDER — METHOCARBAMOL 500 MG PO TABS: 500.0000 mg | ORAL_TABLET | Freq: Three times a day (TID) | ORAL | 0 refills | Status: AC | PRN

## 2019-09-05 MED ORDER — BUPIVACAINE-EPINEPHRINE 0.25% -1:200000 IJ SOLN
INTRAMUSCULAR | Status: DC | PRN
  Administered 2019-09-05: 10 mL

## 2019-09-05 MED ORDER — TRANEXAMIC ACID 1000 MG/10ML IV SOLN
INTRAVENOUS | Status: DC | PRN
  Administered 2019-09-05: 1000 mg via INTRAVENOUS

## 2019-09-05 MED ORDER — OXYCODONE HCL 5 MG PO TABS
ORAL_TABLET | ORAL | Status: AC
  Filled 2019-09-05: qty 1

## 2019-09-05 MED ORDER — DEXAMETHASONE SODIUM PHOSPHATE 10 MG/ML IJ SOLN
INTRAMUSCULAR | Status: AC
  Filled 2019-09-05: qty 1

## 2019-09-05 MED ORDER — PHENYLEPHRINE 40 MCG/ML (10ML) SYRINGE FOR IV PUSH (FOR BLOOD PRESSURE SUPPORT)
PREFILLED_SYRINGE | INTRAVENOUS | Status: DC | PRN
  Administered 2019-09-05 (×2): 120 ug via INTRAVENOUS
  Administered 2019-09-05 (×2): 80 ug via INTRAVENOUS

## 2019-09-05 MED ORDER — SODIUM CHLORIDE 0.9% FLUSH: 3.0000 mL | INTRAVENOUS | Status: DC | PRN

## 2019-09-05 MED ORDER — OXYCODONE HCL 5 MG/5ML PO SOLN: 5.0000 mg | Freq: Once | ORAL | Status: AC | PRN

## 2019-09-05 MED ORDER — PHENOL 1.4 % MT LIQD: 1.0000 | OROMUCOSAL | Status: DC | PRN

## 2019-09-05 MED ORDER — OXYCODONE HCL 5 MG PO TABS: 10.0000 mg | ORAL_TABLET | ORAL | Status: DC | PRN

## 2019-09-05 MED ORDER — SODIUM CHLORIDE 0.9 % IV SOLN: 250.0000 mL | INTRAVENOUS | Status: DC

## 2019-09-05 MED ORDER — ONDANSETRON HCL 4 MG PO TABS: 4.0000 mg | ORAL_TABLET | Freq: Four times a day (QID) | ORAL | Status: DC | PRN

## 2019-09-05 MED ORDER — LIDOCAINE 2% (20 MG/ML) 5 ML SYRINGE
INTRAMUSCULAR | Status: AC
  Filled 2019-09-05: qty 5

## 2019-09-05 MED ORDER — METHOCARBAMOL 500 MG PO TABS
ORAL_TABLET | ORAL | Status: AC
  Filled 2019-09-05: qty 1

## 2019-09-05 MED ORDER — ONDANSETRON HCL 4 MG PO TABS: 4.0000 mg | ORAL_TABLET | Freq: Three times a day (TID) | ORAL | 0 refills | Status: DC | PRN

## 2019-09-05 MED ORDER — TRANEXAMIC ACID-NACL 1000-0.7 MG/100ML-% IV SOLN
INTRAVENOUS | Status: AC
  Filled 2019-09-05: qty 100

## 2019-09-05 MED FILL — OXYCODONE-APAP 10-325: 10-325 | 5 days supply | Qty: 20 | Fill #0

## 2019-09-05 MED FILL — ONDANSETRON HCL 4 MG TABLET: 4 | 6 days supply | Qty: 20 | Fill #0

## 2019-09-05 MED FILL — METHOCARBAMOL 500 MG TABS: 500 | 5 days supply | Qty: 15 | Fill #0

## 2019-09-05 SURGICAL SUPPLY — 54 items
AGENT HMST KT MTR STRL THRMB (HEMOSTASIS) ×1
BNDG GAUZE ELAST 4 BULKY (GAUZE/BANDAGES/DRESSINGS) ×3 IMPLANT
CANISTER SUCT 3000ML PPV (MISCELLANEOUS) ×3 IMPLANT
CLOSURE STERI-STRIP 1/2X4 (GAUZE/BANDAGES/DRESSINGS) ×1
CLSR STERI-STRIP ANTIMIC 1/2X4 (GAUZE/BANDAGES/DRESSINGS) ×2 IMPLANT
COVER SURGICAL LIGHT HANDLE (MISCELLANEOUS) ×3 IMPLANT
COVER WAND RF STERILE (DRAPES) ×3 IMPLANT
DRAPE POUCH INSTRU U-SHP 10X18 (DRAPES) ×3 IMPLANT
DRAPE SURG 17X23 STRL (DRAPES) ×3 IMPLANT
DRAPE U-SHAPE 47X51 STRL (DRAPES) ×3 IMPLANT
DRSG OPSITE POSTOP 3X4 (GAUZE/BANDAGES/DRESSINGS) ×3 IMPLANT
DRSG OPSITE POSTOP 4X6 (GAUZE/BANDAGES/DRESSINGS) ×2 IMPLANT
DURAPREP 26ML APPLICATOR (WOUND CARE) ×3 IMPLANT
ELECT CAUTERY BLADE 6.4 (BLADE) ×3 IMPLANT
ELECT PENCIL ROCKER SW 15FT (MISCELLANEOUS) ×3 IMPLANT
ELECT REM PT RETURN 9FT ADLT (ELECTROSURGICAL) ×3
ELECTRODE REM PT RTRN 9FT ADLT (ELECTROSURGICAL) ×1 IMPLANT
GLOVE BIO SURGEON STRL SZ 6.5 (GLOVE) ×2 IMPLANT
GLOVE BIO SURGEONS STRL SZ 6.5 (GLOVE) ×1
GLOVE BIOGEL PI IND STRL 6.5 (GLOVE) ×1 IMPLANT
GLOVE BIOGEL PI IND STRL 8.5 (GLOVE) ×1 IMPLANT
GLOVE BIOGEL PI INDICATOR 6.5 (GLOVE) ×2
GLOVE BIOGEL PI INDICATOR 8.5 (GLOVE) ×2
GLOVE SS BIOGEL STRL SZ 8.5 (GLOVE) ×1 IMPLANT
GLOVE SUPERSENSE BIOGEL SZ 8.5 (GLOVE) ×2
GOWN STRL REUS W/ TWL LRG LVL3 (GOWN DISPOSABLE) ×2 IMPLANT
GOWN STRL REUS W/TWL 2XL LVL3 (GOWN DISPOSABLE) ×3 IMPLANT
GOWN STRL REUS W/TWL LRG LVL3 (GOWN DISPOSABLE) ×6
KIT BASIN OR (CUSTOM PROCEDURE TRAY) ×3 IMPLANT
KIT TURNOVER KIT B (KITS) ×3 IMPLANT
NDL SPNL 18GX3.5 QUINCKE PK (NEEDLE) ×2 IMPLANT
NEEDLE SPNL 18GX3.5 QUINCKE PK (NEEDLE) ×6 IMPLANT
NS IRRIG 1000ML POUR BTL (IV SOLUTION) ×3 IMPLANT
PACK LAMINECTOMY ORTHO (CUSTOM PROCEDURE TRAY) ×3 IMPLANT
PACK UNIVERSAL I (CUSTOM PROCEDURE TRAY) ×3 IMPLANT
PAD ARMBOARD 7.5X6 YLW CONV (MISCELLANEOUS) ×6 IMPLANT
PATTIES SURGICAL .5 X.5 (GAUZE/BANDAGES/DRESSINGS) ×3 IMPLANT
PATTIES SURGICAL .5 X1 (DISPOSABLE) ×3 IMPLANT
SPONGE SURGIFOAM ABS GEL 100 (HEMOSTASIS) ×2 IMPLANT
SURGIFLO W/THROMBIN 8M KIT (HEMOSTASIS) ×2 IMPLANT
SUT BONE WAX W31G (SUTURE) ×3 IMPLANT
SUT MON AB 3-0 SH 27 (SUTURE) ×3
SUT MON AB 3-0 SH27 (SUTURE) ×1 IMPLANT
SUT VIC AB 0 CT1 27 (SUTURE) ×3
SUT VIC AB 0 CT1 27XBRD ANBCTR (SUTURE) IMPLANT
SUT VIC AB 1 CT1 18XCR BRD 8 (SUTURE) ×1 IMPLANT
SUT VIC AB 1 CT1 8-18 (SUTURE) ×3
SUT VIC AB 2-0 CT1 18 (SUTURE) ×3 IMPLANT
SYR BULB IRRIGATION 50ML (SYRINGE) ×3 IMPLANT
SYR CONTROL 10ML LL (SYRINGE) ×3 IMPLANT
TOWEL GREEN STERILE (TOWEL DISPOSABLE) ×3 IMPLANT
TOWEL GREEN STERILE FF (TOWEL DISPOSABLE) ×3 IMPLANT
WATER STERILE IRR 1000ML POUR (IV SOLUTION) ×3 IMPLANT
YANKAUER SUCT BULB TIP NO VENT (SUCTIONS) IMPLANT

## 2019-09-05 NOTE — Op Note (Signed)
Operative report  Preoperative diagnosis :  right L5-S1 disc herniation with S1 radiculopathy  Postoperative diagnosis: Same  Operative procedure: Right L5 laminotomy for discectomy.  CPT code: 79024  Complications: None  First Assistant: Cleta Alberts, PA  Intraoperative findings: Large posterior lateral to the right disc herniation consistent with what was seen on preoperative MRI.  Removed multiple large fragments of disc material consistent with preoperative MRI.  At the conclusion of the case S1 nerve root was freely mobile and I could easily palpate circumferentially at the level of the disc space confirming had an adequate decompression.  Indications: Alicia Beltran is a very pleasant 33 year old woman with severe onset of radicular leg pain.  Patient had motor and sensory deficits as well as radicular pain in the S1 dermatome.  Imaging studies confirmed a large L5-S1 disc herniation posterior lateral to the right with S1 nerve compression.  As result of the severity of her pain and the size of the disc herniation we elected to move forward with surgery.  All appropriate risks benefits and alternatives were discussed with the patient and consent was obtained.  Operative note: Patient was brought the operating room placed upon the operating room table.  After successful induction of general anesthesia endotracheal ovation teds SCDs were applied she was turned prone onto the Wilson frame.  All bony prominences well-padded and the back was prepped and draped in a standard fashion.  Timeout was taken to confirm patient procedure and all other important data.  2 needles were placed in the back and an intraoperative x-ray was taken to confirm the L5-S1 disc space level.  I marked out the incision site and infiltrated with quarter percent Marcaine.  Midline incision was made and sharp dissection was carried out down to the deep fascia.  The deep fascia was sharply incised and I stripped the paraspinal  muscles to expose the L5 and the S1 lamina and the L5-S1 facet complex.  Penfield 4 was placed underneath the L5 lamina and a second x-ray was taken confirming that I was at the appropriate level.  Taylor retractor was placed along the lateral aspect of the facet complex to allow visualization of the posterior lateral aspect of the spine.  Laminotomy was performed with a 3 mm Kerrison rongeur and I gently dissected through the ligamentum flavum and the Penfield 4.  Once I created the plane between the ligamentum flavum and the underlying epidural fat I used my 2 and 3 mm Kerrison punch to remove the ligamentum flavum and expose the dural fat and nerve root.  I was able to identify the S1 nerve root.  It was significantly dorsally displaced and immobile.  I then used my Kerrison rongeur to resect the lateral aspect of the facet complex in order to gain access into the lateral recess.  At this point I was able to gently begin to manipulate the thecal sac and S1 nerve root medially.  I maintained the retraction with neuro patties and exposed the dorsal surface of the annulus.  It was a large central and right sided disc herniation consistent with the preoperative MRI.  Annulotomy was performed with 15 blade scalpel and using a nerve hook I swept circumferentially underneath the annulus mobilizing the fragments of disc material.  This allowed me to remove them with a micropituitary rongeurs.  Multiple large fragments of disc material were removed.  I then used the Epstein curettes to debride the hard disc osteophyte that had formed.  At this point I could  now easily retract the thecal sac and the S1 nerve root to expose more centrally.  I again swept circumferentially under the annulus to ensure that there was no fragments of disc material.  I then entered into the disc space with a micropituitary rongeurs to debride any loose fragments of material.  At this point I could now easily pass my [facility] along the  undersurface of the thecal sac.  The dorsally displaced annulus was present at the onset of the case was no longer present.  It was easily depressible.  There was no further central or lateral recess stenosis noted.  I could easily mobilize the S1 nerve with my nerve hook.  I was able to palpate the S1 pedicle and confirm satisfactory decompression.  I could easily pass my nerve hook and Woodson elevator in the lateral recess superiorly towards the L4 pedicle and inferiorly at to the S1 pedicle and into the S1 foramen I could also circumferentially mobilize those 2 devices underneath the thecal sac at the level of the disc space.  There was no further compression of the thecal sac and no difficulty moving my implants under the thecal sac.  At this point I was convinced that adequately decompress the nerve root and thecal sac.   Using bipolar cautery I coagulated the epidural veins and obtained hemostasis.  After final irrigation I applied 20 mg (1/2 cc) of Depo-Medrol over the S1 nerve root to improve postoperative analgesia.  Thrombin-soaked Gelfoam patty was placed over the laminotomy site and I remove the retractors and closed the deep fascia with interrupted #1 Vicryl sutures.  I then closed in a layered fashion with a running 0 Vicryl suture, 2-0 Vicryl suture, and a 3-0 Monocryl for skin.  There is strips dry dressings were applied and the patient was ultimately extubated transfer the PACU without incident.  The end of the case all needle and sponge counts were correct.

## 2019-09-05 NOTE — Anesthesia Postprocedure Evaluation (Signed)
Anesthesia Post Note  Patient: Alicia Beltran  Procedure(s) Performed: Right Lumbar five through Sacral one disectomy SAME DAY DISCHARGE (Right Spine Lumbar)     Patient location during evaluation: PACU Anesthesia Type: General Level of consciousness: awake and alert and oriented Pain management: pain level controlled Vital Signs Assessment: post-procedure vital signs reviewed and stable Respiratory status: spontaneous breathing, nonlabored ventilation and respiratory function stable Cardiovascular status: blood pressure returned to baseline Postop Assessment: no apparent nausea or vomiting Anesthetic complications: no    Last Vitals:  Vitals:   09/05/19 1451 09/05/19 1506  BP: 125/85 125/89  Pulse: 93 93  Resp: 12 13  Temp:    SpO2: 96% 95%    Last Pain:  Vitals:   09/05/19 1506  PainSc: Elizabeth Bianna Haran

## 2019-09-05 NOTE — Anesthesia Procedure Notes (Signed)
Procedure Name: Intubation Date/Time: 09/05/2019 10:22 AM Performed by: Jenne Campus, CRNA Pre-anesthesia Checklist: Patient identified, Emergency Drugs available, Suction available and Patient being monitored Patient Re-evaluated:Patient Re-evaluated prior to induction Oxygen Delivery Method: Circle System Utilized Preoxygenation: Pre-oxygenation with 100% oxygen Induction Type: IV induction Ventilation: Mask ventilation without difficulty Laryngoscope Size: Miller and 2 Grade View: Grade I Tube type: Oral Tube size: 7.0 mm Number of attempts: 1 Airway Equipment and Method: Stylet Placement Confirmation: ETT inserted through vocal cords under direct vision,  positive ETCO2 and breath sounds checked- equal and bilateral Secured at: 21 cm Tube secured with: Tape Dental Injury: Teeth and Oropharynx as per pre-operative assessment

## 2019-09-05 NOTE — Progress Notes (Signed)
Orthopedic Tech Progress Note Patient Details:  Alicia Beltran July 02, 1986 [id number] Called in order to [employer] for a LSO/VERTALIGN brace. Patient ID: Alicia Beltran, female   DOB: Jan 18, 1986, 33 y.o.   MRN: [id number]   Janit Pagan 09/05/2019, 1:36 PM

## 2019-09-05 NOTE — H&P (Signed)
Addendum H&P: Patient presents today for lumbar microdiscectomy for radiculopathy.  Patient continues to have severe debilitating right radicular leg pain in the S1 dermatome.  MRI confirms a large posterior lateral to the right L5-S1 disc herniation with S1 nerve compression.  Patient's preoperative labs are consistent with a UTI and she has received antibiotics and will receive IV antibiotics preoperatively.  I have gone over the risks benefits and alternatives to surgery with the patient and all of her questions were encouraged and addressed.  There is been no significant change in her clinical exam since her last office visit of 08/28/2019.

## 2019-09-05 NOTE — Brief Op Note (Signed)
09/05/2019  12:26 PM  PATIENT:  Alicia Beltran  33 y.o. female  PRE-OPERATIVE DIAGNOSIS:  Right L5-S1 herniated disc with radiculopathy  POST-OPERATIVE DIAGNOSIS:  Right L5-S1 herniated disc with radiculopathy  PROCEDURE:  Procedure(s) with comments: Right Lumbar five through Sacral one disectomy SAME DAY DISCHARGE (Right) - 2.5 hrs  SURGEON:  Surgeon(s) and Role:    Melina Schools, MD - Primary  PHYSICIAN ASSISTANT:   ASSISTANTS: Amanda Ward, PA   ANESTHESIA:   general  EBL:  100 mL   BLOOD ADMINISTERED:none  DRAINS: none   LOCAL MEDICATIONS USED:  MARCAINE    and OTHER depomedrol  SPECIMEN:  No Specimen  DISPOSITION OF SPECIMEN:  N/A  COUNTS:  YES  TOURNIQUET:  * No tourniquets in log *  DICTATION: .Dragon Dictation  PLAN OF CARE: Discharge to home after PACU  PATIENT DISPOSITION:  PACU - hemodynamically stable.

## 2019-09-05 NOTE — OR Nursing (Signed)
Per protocol Dr. Quintella Reichert called on 09/05/2019 at 11:13 AM and confirmed instrumentation was at L5-S1.

## 2019-09-05 NOTE — Discharge Instructions (Signed)

## 2019-09-05 NOTE — Transfer of Care (Signed)
Immediate Anesthesia Transfer of Care Note  Patient: Alicia Beltran  Procedure(s) Performed: Right Lumbar five through Sacral one disectomy SAME DAY DISCHARGE (Right Spine Lumbar)  Patient Location: PACU  Anesthesia Type:General  Level of Consciousness: oriented, drowsy and patient cooperative  Airway & Oxygen Therapy: Patient Spontanous Breathing and Patient connected to nasal cannula oxygen  Post-op Assessment: Report given to RN and Post -op Vital signs reviewed and stable  Post vital signs: Reviewed  Last Vitals:  Vitals Value Taken Time  BP    Temp    Pulse 109 09/05/19 1252  Resp 16 09/05/19 1252  SpO2 98 % 09/05/19 1252  Vitals shown include unvalidated device data.  Last Pain:  Vitals:   09/05/19 0846  PainSc: 8       Patients Stated Pain Goal: 5 (59/16/38 4665)  Complications: No apparent anesthesia complications

## 2019-09-06 MED FILL — Thrombin (Recombinant) For Soln 20000 Unit: CUTANEOUS | Qty: 1 | Status: AC

## 2019-09-15 IMAGING — DX DG FINGER INDEX 2+V*R*
3 series · 3 of 3 positions shown · non-contrast
Comparison: None.

CLINICAL DATA: Crush injury.  Pain.

EXAM:
RIGHT INDEX FINGER 2+V

[finger pa]
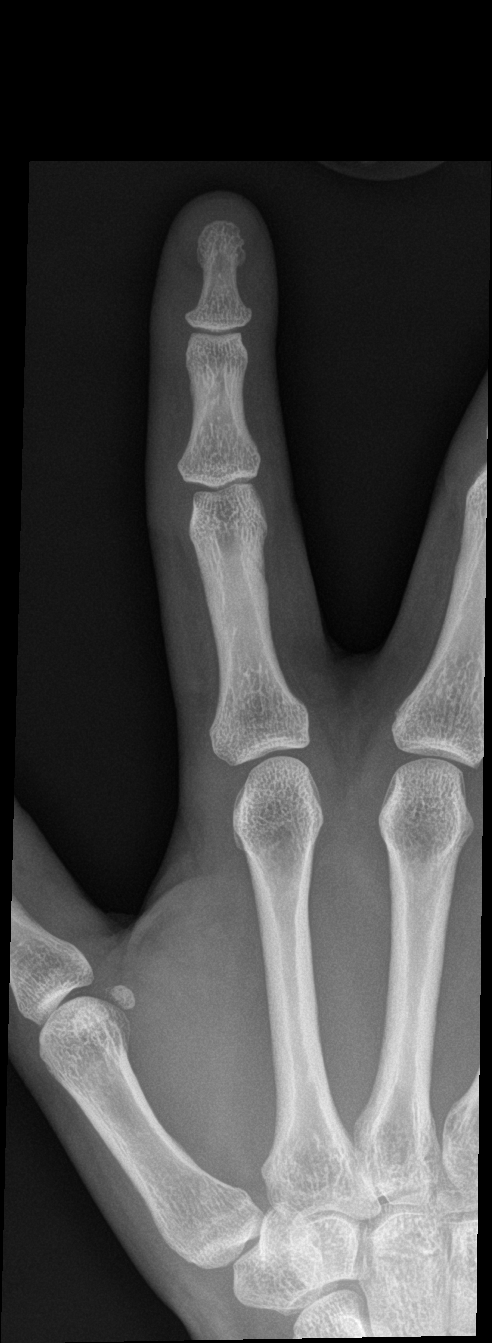

[finger obl]
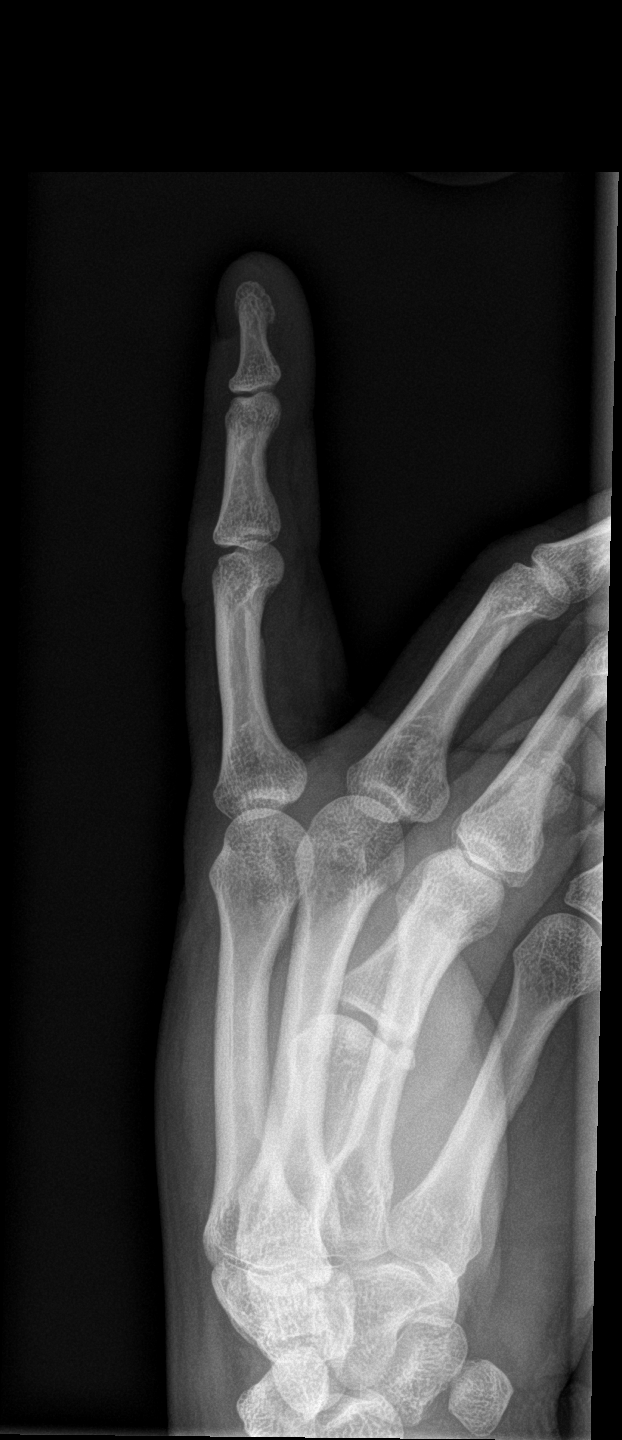

[finger lat]
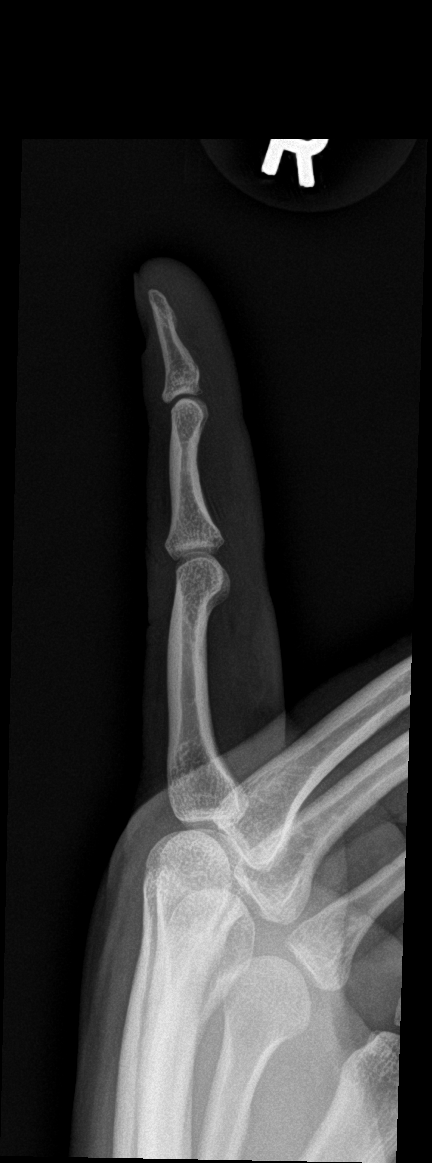

[3 of 3 positions shown; findings below may reference images not displayed]

FINDINGS: There is no evidence of fracture or dislocation. There is no
evidence of arthropathy or other focal bone abnormality. Mild soft
tissue swelling.
IMPRESSION: Negative for fracture.

## 2019-10-16 DIAGNOSIS — M545 Low back pain: Secondary | ICD-10-CM | POA: Diagnosis not present

## 2019-10-18 DIAGNOSIS — M545 Low back pain: Secondary | ICD-10-CM | POA: Diagnosis not present

## 2019-10-24 DIAGNOSIS — M545 Low back pain: Secondary | ICD-10-CM | POA: Diagnosis not present

## 2019-10-26 DIAGNOSIS — M545 Low back pain: Secondary | ICD-10-CM | POA: Diagnosis not present

## 2019-10-29 DIAGNOSIS — M545 Low back pain: Secondary | ICD-10-CM | POA: Diagnosis not present

## 2019-11-01 DIAGNOSIS — M545 Low back pain: Secondary | ICD-10-CM | POA: Diagnosis not present

## 2019-11-05 DIAGNOSIS — M545 Low back pain: Secondary | ICD-10-CM | POA: Diagnosis not present

## 2019-11-08 DIAGNOSIS — M545 Low back pain: Secondary | ICD-10-CM | POA: Diagnosis not present

## 2019-11-22 DIAGNOSIS — M545 Low back pain: Secondary | ICD-10-CM | POA: Diagnosis not present

## 2019-11-29 DIAGNOSIS — M545 Low back pain: Secondary | ICD-10-CM | POA: Diagnosis not present

## 2019-12-03 DIAGNOSIS — M545 Low back pain: Secondary | ICD-10-CM | POA: Diagnosis not present

## 2020-02-13 DIAGNOSIS — E669 Obesity, unspecified: Secondary | ICD-10-CM | POA: Diagnosis not present

## 2020-02-13 DIAGNOSIS — Z1389 Encounter for screening for other disorder: Secondary | ICD-10-CM | POA: Diagnosis not present

## 2020-02-13 DIAGNOSIS — Z124 Encounter for screening for malignant neoplasm of cervix: Secondary | ICD-10-CM | POA: Diagnosis not present

## 2020-02-13 DIAGNOSIS — E282 Polycystic ovarian syndrome: Secondary | ICD-10-CM | POA: Diagnosis not present

## 2020-02-13 DIAGNOSIS — Z6829 Body mass index (BMI) 29.0-29.9, adult: Secondary | ICD-10-CM | POA: Diagnosis not present

## 2020-02-13 DIAGNOSIS — Z130 Encounter for screening for diseases of the blood and blood-forming organs and certain disorders involving the immune mechanism: Secondary | ICD-10-CM | POA: Diagnosis not present

## 2020-02-13 DIAGNOSIS — Z01419 Encounter for gynecological examination (general) (routine) without abnormal findings: Secondary | ICD-10-CM | POA: Diagnosis not present

## 2020-02-15 LAB — HM PAP SMEAR

## 2020-02-25 DIAGNOSIS — Z9889 Other specified postprocedural states: Secondary | ICD-10-CM | POA: Diagnosis not present

## 2020-09-30 ENCOUNTER — Telehealth: Payer: 59 | Admitting: Emergency Medicine

## 2020-09-30 ENCOUNTER — Other Ambulatory Visit: Payer: Self-pay | Admitting: Emergency Medicine

## 2020-09-30 DIAGNOSIS — R059 Cough, unspecified: Secondary | ICD-10-CM

## 2020-09-30 DIAGNOSIS — U071 COVID-19: Secondary | ICD-10-CM | POA: Diagnosis not present

## 2020-09-30 MED ORDER — BENZONATATE 100 MG PO CAPS: 100.0000 mg | ORAL_CAPSULE | Freq: Two times a day (BID) | ORAL | 0 refills | Status: DC | PRN

## 2020-09-30 MED FILL — BENZONATATE 100 MG CAPS: 100 | 10 days supply | Qty: 20 | Fill #0

## 2020-09-30 NOTE — Progress Notes (Signed)
E-Visit for Corona Virus Screening  We are sorry you are not feeling well. We are here to help!  Sorry you are still having cough.  The Tessalon perles have generally been controlling the COVID cough better than most cough medicines, so I'm sorry they aren't working for you.  It's unlikely that you are going to get much relief from anything (most cough medicines don't work very well).  I'd recommend you try Delsym (available over-the-counter) next, but keep in mind, some people that get covid have a cough that can last for months.  You have tested positive for COVID-19, meaning that you were infected with the novel coronavirus and could give the virus to others.  It is vitally important that you stay home so you do not spread it to others.      Please continue isolation at home, for at least 10 days since the start of your symptoms and until you have had 24 hours with no fever (without taking a fever reducer) and with improving of symptoms.  If you have no symptoms but tested positive (or all symptoms resolve after 5 days and you have no fever) you can leave your house but continue to wear a mask around others for an additional 5 days. If you have a fever,continue to stay home until you have had 24 hours of no fever. Most cases improve 5-10 days from onset but we have seen a small number of patients who have gotten worse after the 10 days.  Please be sure to watch for worsening symptoms and remain taking the proper precautions.   Go to the nearest hospital ED for assessment if fever/cough/breathlessness are severe or illness seems like a threat to life.    The following symptoms may appear 2-14 days after exposure: . Fever . Cough . Shortness of breath or difficulty breathing . Chills . Repeated shaking with chills . Muscle pain . Headache . Sore throat . New loss of taste or smell . Fatigue . Congestion or runny nose . Nausea or vomiting . Diarrhea  You have been enrolled in [facility] Monitoring for COVID-19. Daily you will receive a questionnaire within the MyChart website. Our COVID-19 response team will be monitoring your responses daily.    You may also take acetaminophen (Tylenol) as needed for fever.  HOME CARE: . Only take medications as instructed by your medical team. . Drink plenty of fluids and get plenty of rest. . A steam or ultrasonic humidifier can help if you have congestion.   GET HELP RIGHT AWAY IF YOU HAVE EMERGENCY WARNING SIGNS.  Call 911 or proceed to your closest emergency facility if: . You develop worsening high fever. . Trouble breathing . Bluish lips or face . Persistent pain or pressure in the chest . New confusion . Inability to wake or stay awake . You cough up blood. . Your symptoms become more severe . Inability to hold down food or fluids  This list is not all possible symptoms. Contact your medical provider for any symptoms that are severe or concerning to you.    Your e-visit answers were reviewed by a board certified advanced clinical practitioner to complete your personal care plan.  Depending on the condition, your plan could have included both over the counter or prescription medications.  If there is a problem please reply once you have received a response from your provider.  Your safety is important to [place].  If you have drug allergies check your prescription carefully.  You can use MyChart to ask questions about today's visit, request a non-urgent call back, or ask for a work or school excuse for 24 hours related to this e-Visit. If it has been greater than 24 hours you will need to follow up with your provider, or enter a new e-Visit to address those concerns. You will get an e-mail in the next two days asking about your experience.  I hope that your e-visit has been valuable and will speed your recovery. Thank you for using e-visits.         Approximately 5 minutes was used in reviewing the patient's chart,  questionnaire, prescribing medications, and documentation.

## 2020-09-30 NOTE — Addendum Note (Signed)
Addended by: Roxy Horseman B on: 09/30/2020 10:02 AM   Modules accepted: Orders

## 2021-02-05 ENCOUNTER — Telehealth: Payer: 59 | Admitting: Physician Assistant

## 2021-02-05 ENCOUNTER — Other Ambulatory Visit (HOSPITAL_COMMUNITY): Payer: Self-pay

## 2021-02-05 DIAGNOSIS — K648 Other hemorrhoids: Secondary | ICD-10-CM | POA: Diagnosis not present

## 2021-02-05 MED ORDER — HYDROCORTISONE ACETATE 25 MG RE SUPP
25.0000 mg | Freq: Two times a day (BID) | RECTAL | 0 refills | Status: DC
  Filled 2021-02-05: qty 12, 6d supply, fill #0

## 2021-02-05 MED ORDER — LIDOCAINE (ANORECTAL) 5 % EX CREA
TOPICAL_CREAM | CUTANEOUS | 0 refills | Status: DC
  Filled 2021-02-05: qty 45, 20d supply, fill #0

## 2021-02-05 NOTE — Progress Notes (Signed)
E-Visit for Hemorrhoid  We are sorry that you are not feeling well. We are here to help!  Hemorrhoids are swollen veins in the rectum. They can cause itching, bleeding, and pain. Hemorrhoids are very common.  In some cases, you can see or feel hemorrhoids around the outside of the rectum. In other cases, you cannot see them because they are hidden inside the rectum. Be patient - It can take months for this to improve or go away.   Hemorrhoids do not always cause symptoms. But when they do, symptoms can include: ?Itching of the skin around the anus ?Bleeding - Bleeding is usually painless. You might see bright red blood after using the toilet. ?Pain - If a blood clot forms inside a hemorrhoid, this can cause pain. It can also cause a lump that you might be able to feel.   What can I do to keep from getting more hemorrhoids? -- The most important thing you can do is to keep from getting constipated. You should have a bowel movement at least a few times a week. When you have a bowel movement, you also should not have to push too much. Plus, your bowel movements should not be too hard. Being constipated and having hard bowel movements can make hemorrhoids worse.   I have prescribed Anusol HC suppositories.  Insert into rectum twice per day for 6 days And topical lidocaine cream Apply topically every 8 hours as needed for pain.  HOME CARE: . Sitz Baths twice daily. Soak buttocks in 2 or 3 inches of warm water for 10 to 15 minutes. Do not add soap, bubble bath, or anything to the water. . Stool softener such as Colace 100 mg twice daily AND Miralax 1 scoop daily until you have regular soft stools . Over the counter Preparation H . Tucks Pads . Witch Hazel  Here are some steps you can take to avoid getting constipated or having hard stools:  ?Eat lots of fruits, vegetables, and other foods with fiber. Fiber helps to increase bowel movements. If you do not get enough fiber from your diet, you can  take fiber supplements. These come in the form of powders, wafers, or pills. Some examples are Metamucil, Citrucel, Benefiber and FiberCon. If you take a fiber supplement, be sure to read the label so you know how much to take. If you're not sure, ask your provider or nurse. ?Take medicines called "stool softeners" such as docusate sodium (sample brand names: Colace, Dulcolax). These medicines increase the number of bowel movements you have. They are safe to take and they can prevent problems later.  You should request a referral for a follow up evaluation with a Gastroenterologist (GI doctor) to evaluate this chronic and relapsing condition - even if it improves to see what further steps need to be taken. This is highly linked to chronic constipation and straining to have a bowel movement. It may require further treatment or surgical intervention.   GET HELP RIGHT AWAY IF: . You develop severe pain . You have heavy bleeding   FOLLOW UP WITH YOUR PRIMARY PROVIDER IF: . If your symptoms do not improve within 10 days  MAKE SURE YOU   Understand these instructions.  Will watch your condition.  Will get help right away if you are not doing well or get worse.  Your e-visit answers were reviewed by a board certified advanced clinical practitioner to complete your personal care plan. Depending upon the condition, your plan could have included  both over the counter or prescription medications.  Your safety is important to [place]. If you have drug allergies check your prescription carefully.   You can use MyChart to ask questions about today's visit, request a non-urgent call back, or ask for a work or school excuse for 24 hours related to this e-Visit. If it has been greater than 24 hours you will need to follow up with your provider, or enter a new e-Visit to address those concerns.  You will get an e-mail with a link to a survey asking about your experience.  We hope that your e-visit has been  valuable and will speed your recovery! Thank you for using e-visits.  I provided 6 minutes of non face-to-face time during this encounter for chart review and documentation.

## 2021-02-06 ENCOUNTER — Other Ambulatory Visit (HOSPITAL_COMMUNITY): Payer: Self-pay

## 2021-04-03 IMAGING — CR DG CHEST 2V
2 series · 2 of 2 positions shown · non-contrast
Comparison: 01/07/2005

CLINICAL DATA: Pre-op respiratory exam for lumbar spine surgery.

EXAM:
CHEST - 2 VIEW

[w chest pa]
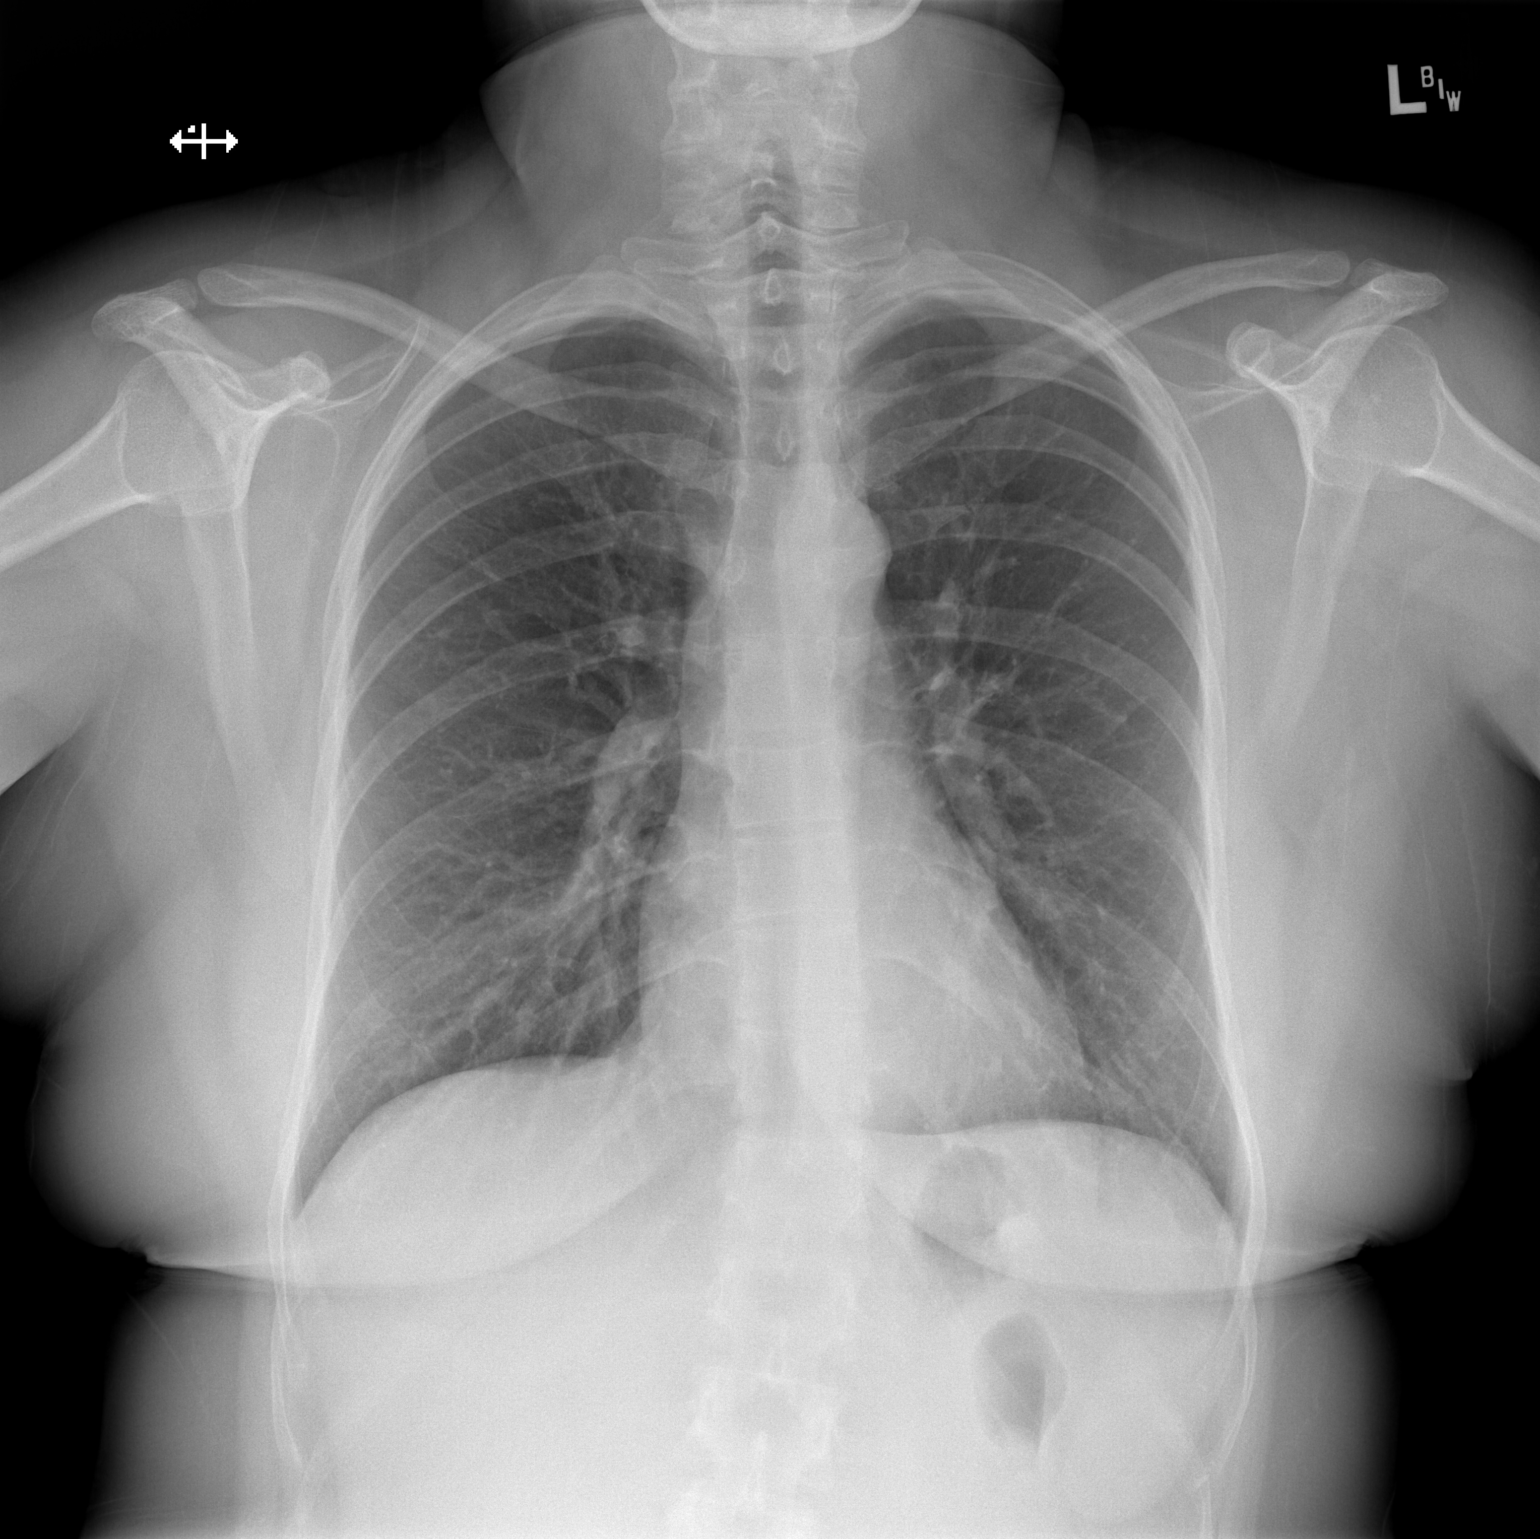

[w chest lat]
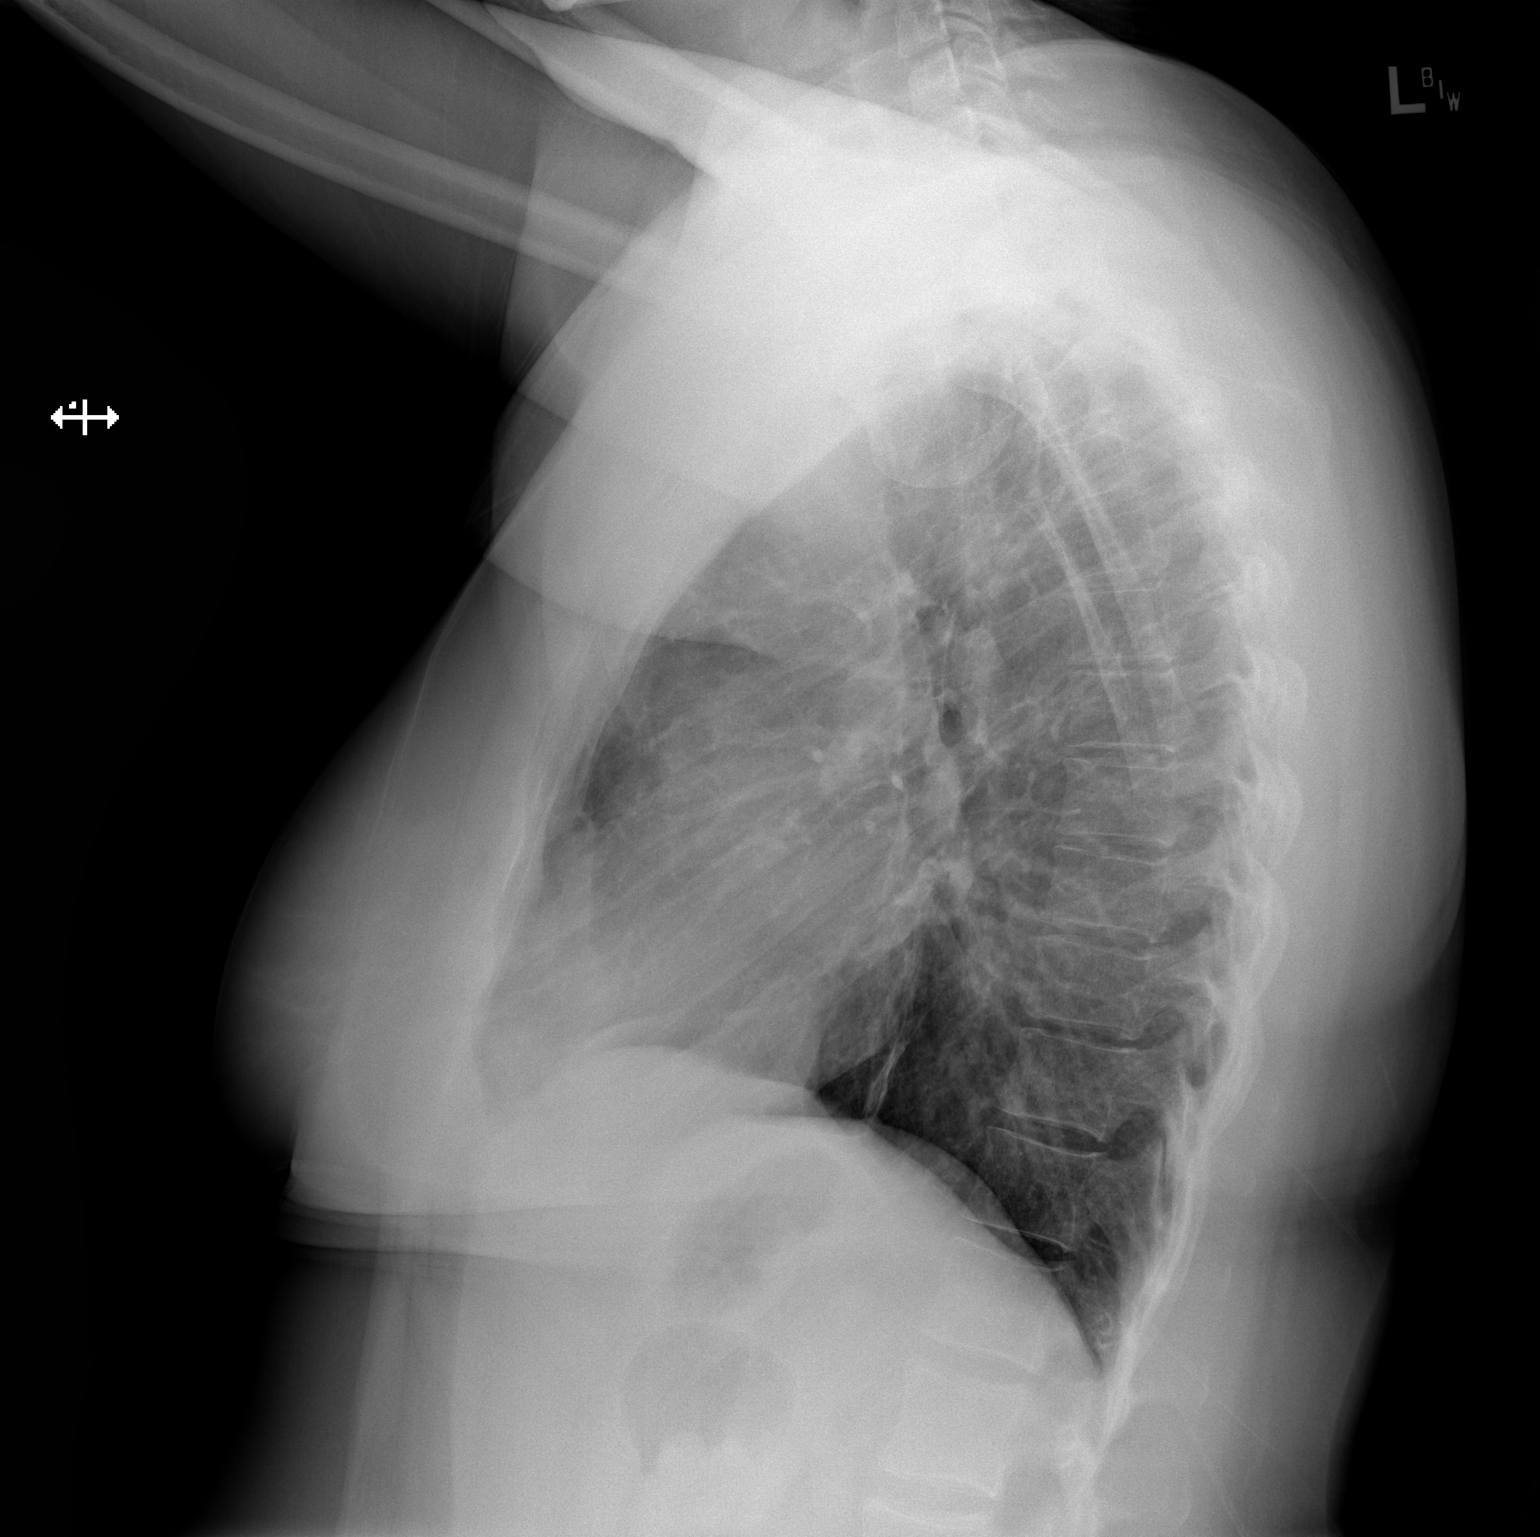

[2 of 2 positions shown; findings below may reference images not displayed]

FINDINGS: The heart size and mediastinal contours are within normal limits.
Both lungs are clear. The visualized skeletal structures are
unremarkable.
IMPRESSION: Normal study.

## 2021-07-19 ENCOUNTER — Telehealth: Payer: 59 | Admitting: Physician Assistant

## 2021-07-19 DIAGNOSIS — H66001 Acute suppurative otitis media without spontaneous rupture of ear drum, right ear: Secondary | ICD-10-CM

## 2021-07-19 DIAGNOSIS — J019 Acute sinusitis, unspecified: Secondary | ICD-10-CM

## 2021-07-19 DIAGNOSIS — B9689 Other specified bacterial agents as the cause of diseases classified elsewhere: Secondary | ICD-10-CM | POA: Diagnosis not present

## 2021-07-19 MED ORDER — AMOXICILLIN-POT CLAVULANATE 875-125 MG PO TABS: 1.0000 | ORAL_TABLET | Freq: Two times a day (BID) | ORAL | 0 refills | Status: DC

## 2021-07-19 NOTE — Progress Notes (Signed)
E-Visit for Sinus Problems  We are sorry that you are not feeling well.  Here is how we plan to help!  Based on what you have shared with me it looks like you have sinusitis.  Sinusitis is inflammation and infection in the sinus cavities of the head.  Based on your presentation I believe you most likely have Acute Bacterial Sinusitis and acute otitis media (ear infection).  This is an infection caused by bacteria and is treated with antibiotics. I have prescribed Augmentin 875mg /125mg  one tablet twice daily with food, for 7 days. You may use an oral decongestant such as Mucinex D or if you have glaucoma or high blood pressure use plain Mucinex. Saline nasal spray help and can safely be used as often as needed for congestion.  If you develop worsening sinus pain, fever or notice severe headache and vision changes, or if symptoms are not better after completion of antibiotic, please schedule an appointment with a health care provider.    Sinus infections are not as easily transmitted as other respiratory infection, however we still recommend that you avoid close contact with loved ones, especially the very young and elderly.  Remember to wash your hands thoroughly throughout the day as this is the number one way to prevent the spread of infection!  Home Care: Only take medications as instructed by your medical team. Complete the entire course of an antibiotic. Do not take these medications with alcohol. A steam or ultrasonic humidifier can help congestion.  You can place a towel over your head and breathe in the steam from hot water coming from a faucet. Avoid close contacts especially the very young and the elderly. Cover your mouth when you cough or sneeze. Always remember to wash your hands.  Get Help Right Away If: You develop worsening fever or sinus pain. You develop a severe head ache or visual changes. Your symptoms persist after you have completed your treatment plan.  Make sure  you Understand these instructions. Will watch your condition. Will get help right away if you are not doing well or get worse.  Thank you for choosing an e-visit.  Your e-visit answers were reviewed by a board certified advanced clinical practitioner to complete your personal care plan. Depending upon the condition, your plan could have included both over the counter or prescription medications.  Please review your pharmacy choice. Make sure the pharmacy is open so you can pick up prescription now. If there is a problem, you may contact your provider through and have the prescription routed to another pharmacy.  Your safety is important to [employer]. If you have drug allergies check your prescription carefully.   For the next 24 hours you can use MyChart to ask questions about today's visit, request a non-urgent call back, or ask for a work or school excuse. You will get an email in the next two days asking about your experience. I hope that your e-visit has been valuable and will speed your recovery.  I provided 6 minutes of non face-to-face time during this encounter for chart review and documentation.

## 2021-09-19 ENCOUNTER — Telehealth: Payer: 59 | Admitting: Nurse Practitioner

## 2021-09-19 DIAGNOSIS — J069 Acute upper respiratory infection, unspecified: Secondary | ICD-10-CM | POA: Diagnosis not present

## 2021-09-19 MED ORDER — FLUTICASONE PROPIONATE 50 MCG/ACT NA SUSP: 2.0000 | Freq: Every day | NASAL | 0 refills | Status: DC

## 2021-09-19 MED ORDER — IBUPROFEN 600 MG PO TABS: 600.0000 mg | ORAL_TABLET | Freq: Three times a day (TID) | ORAL | 0 refills | Status: DC | PRN

## 2021-09-19 MED ORDER — PROMETHAZINE-DM 6.25-15 MG/5ML PO SYRP: 5.0000 mL | ORAL_SOLUTION | Freq: Four times a day (QID) | ORAL | 0 refills | Status: DC | PRN

## 2021-09-19 NOTE — Progress Notes (Signed)
I have spent 5 minutes in review of e-visit questionnaire, review and updating patient chart, medical decision making and response to patient.  ° °Arval Brandstetter W Rexanna Louthan, NP ° °  °

## 2021-09-19 NOTE — Progress Notes (Signed)
E-Visit for Upper Respiratory Infection   We are sorry you are not feeling well.  Here is how we plan to help!  Based on what you have shared with me, it looks like you may have a viral upper respiratory infection.  Upper respiratory infections are caused by a large number of viruses; however, rhinovirus is the most common cause.   Symptoms vary from person to person, with common symptoms including sore throat, cough, fatigue or lack of energy and feeling of general discomfort.  A low-grade fever of up to 100.4 may present, but is often uncommon.  Symptoms vary however, and are closely related to a person's age or underlying illnesses.  The most common symptoms associated with an upper respiratory infection are nasal discharge or congestion, cough, sneezing, headache and pressure in the ears and face.  These symptoms usually persist for about 3 to 10 days, but can last up to 2 weeks.  It is important to know that upper respiratory infections do not cause serious illness or complications in most cases.    Upper respiratory infections can be transmitted from person to person, with the most common method of transmission being a person's hands.  The virus is able to live on the skin and can infect other persons for up to 2 hours after direct contact.  Also, these can be transmitted when someone coughs or sneezes; thus, it is important to cover the mouth to reduce this risk.  To keep the spread of the illness at [place], good hand hygiene is very important.  This is an infection that is most likely caused by a virus. There are no specific treatments other than to help you with the symptoms until the infection runs its course.  We are sorry you are not feeling well.  Here is how we plan to help!   For nasal congestion, you may use an oral decongestants such as Mucinex D or if you have glaucoma or high blood pressure use plain Mucinex.  Saline nasal spray or nasal drops can help and can safely be used as often as  needed for congestion.  For your congestion, I have prescribed Fluticasone nasal spray one spray in each nostril twice a day  If you do not have a history of heart disease, hypertension, diabetes or thyroid disease, prostate/bladder issues or glaucoma, you may also use Sudafed to treat nasal congestion.  It is highly recommended that you consult with a pharmacist or your primary care physician to ensure this medication is safe for you to take.    .   For cough I have prescribed for you A prescription cough medication syrup that has been sent to the pharmacy. You can take ibuprofen for you throat pain. I have sent this as well.   If you have a sore or scratchy throat, use a saltwater gargle-  to  teaspoon of salt dissolved in a 4-ounce to 8-ounce glass of warm water.  Gargle the solution for approximately 15-30 seconds and then spit.  It is important not to swallow the solution.  You can also use throat lozenges/cough drops and Chloraseptic spray to help with throat pain or discomfort.  Warm or cold liquids can also be helpful in relieving throat pain.  For headache, pain or general discomfort, you can use Ibuprofen or Tylenol as directed.   Some authorities believe that zinc sprays or the use of Echinacea may shorten the course of your symptoms.   HOME CARE Only take medications as instructed  by your medical team. Be sure to drink plenty of fluids. Water is fine as well as fruit juices, sodas and electrolyte beverages. You may want to stay away from caffeine or alcohol. If you are nauseated, try taking small sips of liquids. How do you know if you are getting enough fluid? Your urine should be a pale yellow or almost colorless. Get rest. Taking a steamy shower or using a humidifier may help nasal congestion and ease sore throat pain. You can place a towel over your head and breathe in the steam from hot water coming from a faucet. Using a saline nasal spray works much the same way. Cough drops,  hard candies and sore throat lozenges may ease your cough. Avoid close contacts especially the very young and the elderly Cover your mouth if you cough or sneeze Always remember to wash your hands.   GET HELP RIGHT AWAY IF: You develop worsening fever. If your symptoms do not improve within 10 days You develop yellow or green discharge from your nose over 3 days. You have coughing fits You develop a severe head ache or visual changes. You develop shortness of breath, difficulty breathing or start having chest pain Your symptoms persist after you have completed your treatment plan  MAKE SURE YOU  Understand these instructions. Will watch your condition. Will get help right away if you are not doing well or get worse.  Thank you for choosing an e-visit.  Your e-visit answers were reviewed by a board certified advanced clinical practitioner to complete your personal care plan. Depending upon the condition, your plan could have included both over the counter or prescription medications.  Please review your pharmacy choice. Make sure the pharmacy is open so you can pick up prescription now. If there is a problem, you may contact your provider through [employer] and have the prescription routed to another pharmacy.  Your safety is important to [place]. If you have drug allergies check your prescription carefully.   For the next 24 hours you can use MyChart to ask questions about today's visit, request a non-urgent call back, or ask for a work or school excuse. You will get an email in the next two days asking about your experience. I hope that your e-visit has been valuable and will speed your recovery.

## 2021-09-21 ENCOUNTER — Ambulatory Visit: Payer: 59 | Admitting: Family Medicine

## 2021-11-30 ENCOUNTER — Ambulatory Visit: Payer: 59 | Admitting: Family Medicine

## 2021-11-30 ENCOUNTER — Encounter: Payer: Self-pay | Admitting: Family Medicine

## 2021-11-30 ENCOUNTER — Other Ambulatory Visit (HOSPITAL_COMMUNITY): Payer: Self-pay

## 2021-11-30 VITALS — HR 102 | Temp 98.2°F | Resp 16 | Ht 67.0 in | Wt 201.0 lb

## 2021-11-30 DIAGNOSIS — R079 Chest pain, unspecified: Secondary | ICD-10-CM

## 2021-11-30 DIAGNOSIS — R002 Palpitations: Secondary | ICD-10-CM | POA: Diagnosis not present

## 2021-11-30 DIAGNOSIS — K219 Gastro-esophageal reflux disease without esophagitis: Secondary | ICD-10-CM | POA: Diagnosis not present

## 2021-11-30 DIAGNOSIS — R110 Nausea: Secondary | ICD-10-CM | POA: Diagnosis not present

## 2021-11-30 DIAGNOSIS — Z131 Encounter for screening for diabetes mellitus: Secondary | ICD-10-CM | POA: Diagnosis not present

## 2021-11-30 DIAGNOSIS — Z8349 Family history of other endocrine, nutritional and metabolic diseases: Secondary | ICD-10-CM

## 2021-11-30 DIAGNOSIS — R109 Unspecified abdominal pain: Secondary | ICD-10-CM

## 2021-11-30 DIAGNOSIS — Z13220 Encounter for screening for lipoid disorders: Secondary | ICD-10-CM

## 2021-11-30 DIAGNOSIS — F419 Anxiety disorder, unspecified: Secondary | ICD-10-CM | POA: Diagnosis not present

## 2021-11-30 DIAGNOSIS — F5104 Psychophysiologic insomnia: Secondary | ICD-10-CM

## 2021-11-30 LAB — CBC
HCT: 39.4 % (ref 36.0–46.0)
Hemoglobin: 13.1 g/dL (ref 12.0–15.0)
MCHC: 33.3 g/dL (ref 30.0–36.0)
MCV: 88.9 fl (ref 78.0–100.0)
Platelets: 320 10*3/uL (ref 150.0–400.0)
RBC: 4.43 Mil/uL (ref 3.87–5.11)
RDW: 13.4 % (ref 11.5–15.5)
WBC: 10 10*3/uL (ref 4.0–10.5)

## 2021-11-30 LAB — COMPREHENSIVE METABOLIC PANEL
ALT: 18 U/L (ref 0–35)
AST: 15 U/L (ref 0–37)
Albumin: 4.8 g/dL (ref 3.5–5.2)
Alkaline Phosphatase: 62 U/L (ref 39–117)
BUN: 10 mg/dL (ref 6–23)
CO2: 27 mEq/L (ref 19–32)
Calcium: 9.8 mg/dL (ref 8.4–10.5)
Chloride: 101 mEq/L (ref 96–112)
Creatinine, Ser: 0.78 mg/dL (ref 0.40–1.20)
GFR: 96.93 mL/min (ref >60.00)
Glucose, Bld: 68 mg/dL — ABNORMAL LOW (ref 70–99)
Potassium: 4.2 mEq/L (ref 3.5–5.1)
Sodium: 139 mEq/L (ref 135–145)
Total Bilirubin: 0.5 mg/dL (ref 0.2–1.2)
Total Protein: 7.6 g/dL (ref 6.0–8.3)

## 2021-11-30 LAB — LIPID PANEL
Cholesterol: 179 mg/dL (ref 0–200)
HDL: 35.6 mg/dL — ABNORMAL LOW (ref >39.00)
LDL Cholesterol: 115 mg/dL — ABNORMAL HIGH (ref 0–99)
NonHDL: 143.07
Total CHOL/HDL Ratio: 5
Triglycerides: 140 mg/dL (ref 0.0–149.0)
VLDL: 28 mg/dL (ref 0.0–40.0)

## 2021-11-30 MED ORDER — SUCRALFATE 1 G PO TABS
1.0000 g | ORAL_TABLET | Freq: Three times a day (TID) | ORAL | 0 refills | Status: DC
  Filled 2021-11-30: qty 28, 7d supply, fill #0

## 2021-11-30 MED ORDER — SERTRALINE HCL 25 MG PO TABS
25.0000 mg | ORAL_TABLET | Freq: Every day | ORAL | 3 refills | Status: DC
  Filled 2021-11-30: qty 30, 30d supply, fill #0

## 2021-11-30 NOTE — Progress Notes (Signed)
? ?Subjective:  ?Patient ID: Alicia Beltran, female    DOB: [birth year]  Age: 36 y.o. MRN: [id number] ? ?CC:  ?Chief Complaint  ?Patient presents with  ? Chest Pain  ?  Pt reports some burning in her chest and into her neck notes a history of GERD and some history of anxiety unsure if either may be causing these sxs, possible palpitations notes has been on and off for a few months, notes fmaily hxt Hypertension   ? ? ?HPI ?Alicia Beltran presents for  ? ?New pt with symptoms above.  ? ?Chest pain: ?Noted yesterday - woke up with symptoms. burning sensation - left chest, to left neck and back of head, shoulder blade and down left arm. Persistent since yesterday. Still present now. No prior CP with exertion, but palpitations with activity over last year - skipping beats. No current palpitations. No dyspnea or diaphoresis.  ?No hx of heart disease or FH of early CAD - only HTN.  ?No cough/dyspnea.  ?Tx; asa, omeprazole 1st dose yesterday - no changes yet. Fasting today.  ? ?Some chronic nausea, with off and on abdominal pain - more frequent in past year, feeling bloated after meals. Reflux  past year worse at times and food feels like it gets stuck. Worse since illness in January - cold/cough.No regurgitation. Reflux causes cough. ?No known hx of PUD. Does take ibuprofen daily for daily body aches. No black/dark stools. Some constipation at times. Has to use bathroom quickly when anxious at times.  ?Tx: episodic omeprazole in past 1-2 times per week ? ? ?Has been having worse anxiety - more in past year. Worse after almost losing her husband to alcohol and substance abuse. Feels down, decreased motivation, no SI/HI. Body hurts at times.   ?No prior counseling or meds for depression/anxiety. Not interested in counseling at this time.  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   ?Palpitations when anxious.  ?4 hrs sleep at night. Trouble getting to sleep. Tired during day.   ?Caffeine:once in am.  ? ?No alcohol,  tobacco, or IDU ? ? ?  11/30/2021  ?  9:20 AM  ?Depression screen PHQ 2/9  ?Decreased Interest 0  ?Down, Depressed, Hopeless 0  ?PHQ - 2 Score 0  ? ?   ? View : No data to display.  ?  ?  ?  ? ? ? ? ? ? ? ?History ?There are no problems to display for this patient. ? ?Past Medical History:  ?Diagnosis Date  ? Allergies   ? Anxiety   ? GERD (gastroesophageal reflux disease)   ? Headache   ? ?Past Surgical History:  ?Procedure Laterality Date  ? HEMORROIDECTOMY  2010  ? LAPAROSCOPY ABDOMEN DIAGNOSTIC    ? LUMBAR LAMINECTOMY/DECOMPRESSION MICRODISCECTOMY Right 09/05/2019  ? Procedure: Right Lumbar five through Sacral one disectomy SAME DAY DISCHARGE;  Surgeon: Alicia Lick, MD;  Location: [facility] OR;  Service: Orthopedics;  Laterality: Right;  2.5 hrs  ? ?No Known Allergies ?Prior to Admission medications   ?Medication Sig Start Date End Date Taking? Authorizing Provider  ?ibuprofen (ADVIL) 600 MG tablet Take 1 tablet (600 mg total) by mouth every 8 (eight) hours as needed. 09/19/21  Yes Alicia Rigg, NP  ?omeprazole (PRILOSEC) 20 MG capsule Take 20 mg by mouth daily.   Yes [provider]  ?fluticasone (FLONASE) 50 MCG/ACT nasal spray Place 2 sprays into both nostrils daily. ?Patient not taking: Reported on 11/30/2021 09/19/21   Alicia Rigg, NP  ?gabapentin (  NEURONTIN) 300 MG capsule Take 300 mg by mouth 2 (two) times daily.    [provider]  ?Lidocaine, Anorectal, 5 % CREA Apply a small amount topically every 8 hours as needed for pain 02/05/21   Alicia Loveless, PA-C  ?ondansetron (ZOFRAN) 4 MG tablet Take 1 tablet (4 mg total) by mouth every 8 (eight) hours as needed for nausea or vomiting. 09/05/19   Alicia Lick, MD  ?promethazine-dextromethorphan (PROMETHAZINE-DM) 6.25-15 MG/5ML syrup Take 5 mLs by mouth 4 (four) times daily as needed for cough. ?Patient not taking: Reported on 11/30/2021 09/19/21   Alicia Rigg, NP  ? ?Social History  ? ?Socioeconomic History  ? Marital status:  Married  ?  Spouse name: Not on file  ? Number of children: Not on file  ? Years of education: Not on file  ? Highest education level: Not on file  ?Occupational History  ? Not on file  ?Tobacco Use  ? Smoking status: Never  ? Smokeless tobacco: Never  ?Substance and Sexual Activity  ? Alcohol use: Not Currently  ? Drug use: No  ? Sexual activity: Yes  ?  Partners: Male  ?  Birth control/protection: None  ?Other Topics Concern  ? Not on file  ?Social History Narrative  ? Not on file  ? ?Social Determinants of Health  ? ?Financial Resource Strain: Not on file  ?Food Insecurity: Not on file  ?Transportation Needs: Not on file  ?Physical Activity: Not on file  ?Stress: Not on file  ?Social Connections: Not on file  ?Intimate Partner Violence: Not on file  ? ? ?Review of Systems ? ? ?Objective:  ? ?Vitals:  ? 11/30/21 0915  ?Pulse: (!) 102  ?Resp: 16  ?Temp: 98.2 ?F (36.8 ?C)  ?TempSrc: Temporal  ?SpO2: 100%  ?Weight: 201 lb (91.2 kg)  ?Height: 5\' 7"  (1.702 m)  ? ? ? ?Physical Exam ?Vitals reviewed.  ?Constitutional:   ?   General: She is not in acute distress. ?   Appearance: Normal appearance. She is well-developed. She is not ill-appearing, toxic-appearing or diaphoretic.  ?HENT:  ?   Head: Normocephalic and atraumatic.  ?Eyes:  ?   Conjunctiva/sclera: Conjunctivae normal.  ?   Pupils: Pupils are equal, round, and reactive to light.  ?Neck:  ?   Vascular: No carotid bruit.  ?   Comments: No focal bony tenderness of cervical spine.  Locates area of discomfort of the paraspinal muscles of the cervical spine, occipital scalp but no rash, no focal tenderness.  Pain-free range of motion of cervical spine and does not reproduce chest, arm or back symptoms. ? ?Cardiovascular:  ?   Rate and Rhythm: Normal rate and regular rhythm.  ?   Heart sounds: Normal heart sounds.  ?   Comments: Chest wall nontender ?Pulmonary:  ?   Effort: Pulmonary effort is normal.  ?   Breath sounds: Normal breath sounds.  ?Abdominal:  ?   General:  There is no distension.  ?   Palpations: Abdomen is soft. There is no pulsatile mass.  ?   Tenderness: There is no abdominal tenderness. There is no guarding.  ?Musculoskeletal:  ?   Cervical back: Neck supple.  ?   Right lower leg: No edema.  ?   Left lower leg: No edema.  ?Skin: ?   General: Skin is warm and dry.  ?Neurological:  ?   Mental Status: She is alert and oriented to person, place, and time.  ?Psychiatric:     ?  Attention and Perception: Attention normal.     ?   Mood and Affect: Mood normal. Affect is flat.     ?   Speech: Speech normal.     ?   Behavior: Behavior normal. Behavior is cooperative.     ?   Thought Content: Thought content is not paranoid. Thought content does not include homicidal or suicidal ideation.  ? ? ? ?EKG: sinus rhythm, rate 87, no acute ST or T wave changes. No significant changes form 02/09/13 comparison.  ? ? ?Assessment & Plan:  ?Ted McalpineHelen R Beltran is a 36 y.o. female . ?Chest pain, unspecified type ?Nausea - Plan: Ambulatory referral to Gastroenterology ?Intermittent abdominal pain - Plan: Ambulatory referral to Gastroenterology ?Gastroesophageal reflux disease, unspecified whether esophagitis present - Plan: sucralfate (CARAFATE) 1 g tablet, Ambulatory referral to Gastroenterology ? -Reassuring EKG, unlikely cardiac source.  Suspected GERD/esophagitis possible given symptoms above.  Option of ER evaluation.  Chose initial outpatient treatment for GERD.  ER precautions given if any new or worsening symptoms  ? -Increase PPI to twice daily, add Carafate, avoid NSAIDs for now.  Refer to gastroenterology given intermittent abdominal symptoms and nausea.  We will recheck 2 weeks ? ?Screening for diabetes mellitus - Plan: Comprehensive metabolic panel ? ?Family history of thyroid disease in mother - Plan: TSH ?Palpitations - Plan: EKG 12-Lead, CBC, TSH ? -Question anxiety component, with family history of hypothyroidism check TSH.  Check CBC.  Reassuring EKG. ? ?Screening for  hyperlipidemia - Plan: Lipid panel ? ?Anxiety - Plan: sertraline (ZOLOFT) 25 MG tablet ?Psychophysiological insomnia - Plan: sertraline (ZOLOFT) 25 MG tablet ? -Start Zoloft for anxiety with insomnia handout gi

## 2021-11-30 NOTE — Patient Instructions (Addendum)
Increase omeprazole to 2 times per day, add carafate for heartburn. I will refer you to gastroenterology as well. Avoid nsaids like ibuprofen for now.  ?EKG looks ok, and pain in chest may be from heartburn. If any worsening of pain or new symptoms go to the ER.  ?Melatonin for sleep if needed. See info below. Goal of at least 7-8 hours sleep per night.  ?Zoloft for anxiety for now. See info below.  ?Follow up in 2 weeks top review labs and recheck.  ?Return to the clinic or go to the nearest emergency room if any of your symptoms worsen or new symptoms occur. ? ?Managing Anxiety, Adult ?After being diagnosed with anxiety, you may be relieved to know why you have felt or behaved a certain way. You may also feel overwhelmed about the treatment ahead and what it will mean for your life. With care and support, you can manage this condition. ?How to manage lifestyle changes ?Managing stress and anxiety ?Stress is your body's reaction to life changes and events, both good and bad. Most stress will last just a few hours, but stress can be ongoing and can lead to more than just stress. Although stress can play a major role in anxiety, it is not the same as anxiety. Stress is usually caused by something external, such as a deadline, test, or competition. Stress normally passes after the triggering event has ended.  ?Anxiety is caused by something internal, such as imagining a terrible outcome or worrying that something will go wrong that will devastate you. Anxiety often does not go away even after the triggering event is over, and it can become long-term (chronic) worry. It is important to understand the differences between stress and anxiety and to manage your stress effectively so that it does not lead to an anxious response. ?Talk with your health care provider or a counselor to learn more about reducing anxiety and stress. He or she may suggest tension reduction techniques, such as: ?Music therapy. Spend time creating  or listening to music that you enjoy and that inspires you. ?Mindfulness-based meditation. Practice being aware of your normal breaths while not trying to control your breathing. It can be done while sitting or walking. ?Centering prayer. This involves focusing on a word, phrase, or sacred image that means something to you and brings you peace. ?Deep breathing. To do this, expand your stomach and inhale slowly through your nose. Hold your breath for 3-5 seconds. Then exhale slowly, letting your stomach muscles relax. ?Self-talk. Learn to notice and identify thought patterns that lead to anxiety reactions and change those patterns to thoughts that feel peaceful. ?Muscle relaxation. Taking time to tense muscles and then relax them. ?Choose a tension reduction technique that fits your lifestyle and personality. These techniques take time and practice. Set aside 5-15 minutes a day to do them. Therapists can offer counseling and training in these techniques. The training to help with anxiety may be covered by some insurance plans. ?Other things you can do to manage stress and anxiety include: ?Keeping a stress diary. This can help you learn what triggers your reaction and then learn ways to manage your response. ?Thinking about how you react to certain situations. You may not be able to control everything, but you can control your response. ?Making time for activities that help you relax and not feeling guilty about spending your time in this way. ?Doing visual imagery. This involves imagining or creating mental pictures to help you relax. ?Practicing yoga.  Through yoga poses, you can lower tension and promote relaxation. ? ?Medicines ?Medicines can help ease symptoms. Medicines for anxiety include: ?Antidepressant medicines. These are usually prescribed for long-term daily control. ?Anti-anxiety medicines. These may be added in severe cases, especially when panic attacks occur. ?Medicines will be prescribed by a health  care provider. When used together, medicines, psychotherapy, and tension reduction techniques may be the most effective treatment. ?Relationships ?Relationships can play a big part in helping you recover. Try to spend more time connecting with trusted friends and family members. ?Consider going to couples counseling if you have a partner, taking family education classes, or going to family therapy. ?Therapy can help you and others better understand your condition. ?How to recognize changes in your anxiety ?Everyone responds differently to treatment for anxiety. Recovery from anxiety happens when symptoms decrease and stop interfering with your daily activities at home or work. This may mean that you will start to: ?Have better concentration and focus. Worry will interfere less in your daily thinking. ?Sleep better. ?Be less irritable. ?Have more energy. ?Have improved memory. ?It is also important to recognize when your condition is getting worse. Contact your health care provider if your symptoms interfere with home or work and you feel like your condition is not improving. ?Follow these instructions at home: ?Activity ?Exercise. Adults should do the following: ?Exercise for at least 150 minutes each week. The exercise should increase your heart rate and make you sweat (moderate-intensity exercise). ?Strengthening exercises at least twice a week. ?Get the right amount and quality of sleep. Most adults need 7-9 hours of sleep each night. ?Lifestyle ? ?Eat a healthy diet that includes plenty of vegetables, fruits, whole grains, low-fat dairy products, and lean protein. ?Do not eat a lot of foods that are high in fats, added sugars, or salt (sodium). ?Make choices that simplify your life. ?Do not use any products that contain nicotine or tobacco. These products include cigarettes, chewing tobacco, and vaping devices, such as e-cigarettes. If you need help quitting, ask your health care provider. ?Avoid caffeine,  alcohol, and certain over-the-counter cold medicines. These may make you feel worse. Ask your pharmacist which medicines to avoid. ?General instructions ?Take over-the-counter and prescription medicines only as told by your health care provider. ?Keep all follow-up visits. This is important. ?Where to find support ?You can get help and support from these sources: ?Self-help groups. ?Online and [employer]community organizations. ?A trusted spiritual leader. ?Couples counseling. ?Family education classes. ?Family therapy. ?Where to find more information ?You may find that joining a support group helps you deal with your anxiety. The following sources can help you locate counselors or support groups near you: ?Mental Health America: www.mentalhealthamerica.net ?Anxiety and Depression Association of America (ADAA): ProgramCam.dewww.adaa.org ?National Alliance on Mental Illness (NAMI): www.nami.org ?Contact a health care provider if: ?You have a hard time staying focused or finishing daily tasks. ?You spend many hours a day feeling worried about everyday life. ?You become exhausted by worry. ?You start to have headaches or frequently feel tense. ?You develop chronic nausea or diarrhea. ?Get help right away if: ?You have a racing heart and shortness of breath. ?You have thoughts of hurting yourself or others. ?If you ever feel like you may hurt yourself or others, or have thoughts about taking your own life, get help right away. Go to your nearest emergency department or: ?Call your local emergency services (911 in the [place] ?Call a suicide crisis helpline, such as the National Suicide Prevention Lifeline at 224-511-10841-(709)176-4877  or 988 in the [place] This is open 24 hours a day in the [place] ?Text the Crisis Text Line at (973) 608-7879 (in the [place] ?Summary ?Taking steps to learn and use tension reduction techniques can help calm you and help prevent triggering an anxiety reaction. ?When used together, medicines, psychotherapy, and tension reduction techniques  may be the most effective treatment. ?Family, friends, and partners can play a big part in supporting you. ?This information is not intended to replace advice given to you by your health care provider. Ma

## 2021-12-01 ENCOUNTER — Encounter: Payer: Self-pay | Admitting: Internal Medicine

## 2021-12-01 LAB — TSH: TSH: 2.58 u[IU]/mL (ref 0.35–5.50)

## 2021-12-10 ENCOUNTER — Ambulatory Visit: Payer: 59 | Admitting: Internal Medicine

## 2021-12-10 ENCOUNTER — Encounter: Payer: Self-pay | Admitting: Internal Medicine

## 2021-12-10 ENCOUNTER — Ambulatory Visit (INDEPENDENT_AMBULATORY_CARE_PROVIDER_SITE_OTHER)
Admission: RE | Admit: 2021-12-10 | Discharge: 2021-12-10 | Disposition: A | Discharge status: Home / Self Care | Payer: 59 | Source: Ambulatory Visit | Attending: Internal Medicine | Admitting: Internal Medicine

## 2021-12-10 VITALS — BP 124/86 | HR 96 | Ht 67.0 in | Wt 200.5 lb

## 2021-12-10 DIAGNOSIS — R140 Abdominal distension (gaseous): Secondary | ICD-10-CM

## 2021-12-10 DIAGNOSIS — K219 Gastro-esophageal reflux disease without esophagitis: Secondary | ICD-10-CM

## 2021-12-10 DIAGNOSIS — R1310 Dysphagia, unspecified: Secondary | ICD-10-CM | POA: Diagnosis not present

## 2021-12-10 NOTE — Patient Instructions (Signed)
If you are age 38 or older, your body mass index should be between 23-30. Your Body mass index is 31.4 kg/m?Marland Kitchen If this is out of the aforementioned range listed, please consider follow up with your Primary Care Provider. ? ?If you are age 73 or younger, your body mass index should be between 19-25. Your Body mass index is 31.4 kg/m?Marland Kitchen If this is out of the aformentioned range listed, please consider follow up with your Primary Care Provider.  ? ?Your provider has requested that you have an abdominal x ray before leaving today. Please go to the basement floor to our Radiology department for the test. ? ?The  GI providers would like to encourage you to use [facility] to communicate with providers for non-urgent requests or questions.  Due to long hold times on the telephone, sending your provider a message by [facility] may be a faster and more efficient way to get a response.  Please allow 48 business hours for a response.  Please remember that this is for non-urgent requests.  ? ?It was a pleasure to see you today! ? ?Thank you for trusting me with your gastrointestinal care!   ? ?Eulah Pont, MD  ?

## 2021-12-10 NOTE — Progress Notes (Signed)
? ?Chief Complaint: Abdominal pain, dysphagia ? ?HPI : 36 year old female with history of GERD, anal fissure, headache presents with ab pain and dysphagia ? ?She has been under a lot of stress recently. Last year she had COVID she had a really bad cough that started making her reflux worse. She feels like she has sensations of reflux in her throat all the time. Whenever she eats, she feels like certain foods get stuck. She feels like food gets stuck in the bottom of the esophagus. The dysphagia has been getting worse over time. Denies prior EGD. She has constant burning in her chest and goes to her back to her shoulder blades. She went to her PCP and had a cardiac work up. Every time that she eats, she has burning and nausea. Denies vomiting. She is on omeprazole 20 mg BID. She was started on carafate suspension that helped temporarily but then stopped working. She has abdominal pain as well. Her stomach stays bloated all the time. After she goes to the bathroom, she still feels like she is bloated. She has gained weight over time. She on average has 3-4 BMs per day. She will sometimes have more frequent BMs if she is nervous. She will also have some issues with constipation on occasion.  ? ?She works as a Quarry manager at [employer].   ? ? ?Past Medical History:  ?Diagnosis Date  ? Allergies   ? Anal fissure   ? Anxiety   ? Depression   ? GERD (gastroesophageal reflux disease)   ? Headache   ? ?Past Surgical History:  ?Procedure Laterality Date  ? HEMORROIDECTOMY  2010  ? LAPAROSCOPY ABDOMEN DIAGNOSTIC    ? LUMBAR LAMINECTOMY/DECOMPRESSION MICRODISCECTOMY Right 09/05/2019  ? Procedure: Right Lumbar five through Sacral one disectomy SAME DAY DISCHARGE;  Surgeon: Melina Schools, MD;  Location: [facility];  Service: Orthopedics;  Laterality: Right;  2.5 hrs  ? ?Family History  ?Problem Relation Age of Onset  ? Arthritis Mother   ? Depression Mother   ? Hypertension Mother   ? Thyroid disease Mother   ? Hyperlipidemia  Father   ? Hypertension Father   ? Early death Maternal Grandmother   ? Early death Maternal Grandfather   ? ?Social History  ? ?Tobacco Use  ? Smoking status: Never  ? Smokeless tobacco: Never  ?Vaping Use  ? Vaping Use: Never used  ?Substance Use Topics  ? Alcohol use: Not Currently  ? Drug use: No  ? ?Current Outpatient Medications  ?Medication Sig Dispense Refill  ? fluticasone (FLONASE) 50 MCG/ACT nasal spray Place 2 sprays into both nostrils daily. 16 g 0  ? omeprazole (PRILOSEC) 20 MG capsule Take 20 mg by mouth daily.    ? sertraline (ZOLOFT) 25 MG tablet Take 1 tablet (25 mg total) by mouth daily. 30 tablet 3  ? ?No current facility-administered medications for this visit.  ? ?No Known Allergies ? ? ?Review of Systems: ?All systems reviewed and negative except where noted in HPI.  ? ?Physical Exam: ?BP 124/86 (BP Location: Left Arm, Patient Position: Sitting, Cuff Size: Normal)   Pulse 96   Ht 5\' 7"  (1.702 m)   Wt 200 lb 8 oz (90.9 kg)   LMP 10/20/2021   BMI 31.40 kg/m?  ?Constitutional: Pleasant,well-developed, female in no acute distress. ?HEENT: Normocephalic and atraumatic. Conjunctivae are normal. No scleral icterus. ?Cardiovascular: Normal rate, regular rhythm.  ?Pulmonary/chest: Effort normal and breath sounds normal. No wheezing, rales or rhonchi. ?Abdominal:  Soft, nondistended, nontender. Bowel sounds active throughout. There are no masses palpable. No hepatomegaly. ?Extremities: No edema ?Neurological: Alert and oriented to person place and time. ?Skin: Skin is warm and dry. No rashes noted. ?Psychiatric: Normal mood and affect. Behavior is normal. ? ?Labs 11/2021: CMP unremarkable, CBC nml. TSH nml. ? ?CT A/P w/contrast 10/2010: ?IMPRESSION:  ?1.  No acute abdominal pelvic findings identified.  No evidence of bowel obstruction or appendicitis.  ?2.  Prominent perirectal fat, similar to the prior examination.  ?3.  Slightly enlarged cystic lesion in the lower pole of the right  ?kidney now  appears slightly septated.  A cyst has been identified in this area on prior ultrasound.  ? ?ASSESSMENT AND PLAN: ?Dysphagia ?GERD ?Bloating ?Alternating constipation and diarrhea ?Patient presents with longstanding dysphagia that seems to be worsening over time.  She has also been experiencing uncontrolled reflux symptoms. Will plan for EGD to assess for peptic stricture and rule out malignancy. Will also plan to obtai biopsies to rule out EoE. Patient also describes some irregular bowel habits (alternating constipation and diarrhea) so will plan for a KUB to assess for stool burden. Patient may be experiencing overflow diarrhea that is leading to the frequent stools. ?- Cont omeprazole 20 mg BID ?- Check KUB  ?- EGD [facility] ? ?Christia Reading, MD ? ?

## 2021-12-17 ENCOUNTER — Ambulatory Visit (INDEPENDENT_AMBULATORY_CARE_PROVIDER_SITE_OTHER): Payer: 59 | Admitting: Family Medicine

## 2021-12-17 ENCOUNTER — Encounter: Payer: Self-pay | Admitting: Family Medicine

## 2021-12-17 VITALS — BP 126/74 | HR 71 | Temp 98.2°F | Resp 16 | Ht 67.0 in | Wt 202.2 lb

## 2021-12-17 DIAGNOSIS — R109 Unspecified abdominal pain: Secondary | ICD-10-CM

## 2021-12-17 DIAGNOSIS — R079 Chest pain, unspecified: Secondary | ICD-10-CM

## 2021-12-17 DIAGNOSIS — F419 Anxiety disorder, unspecified: Secondary | ICD-10-CM

## 2021-12-17 DIAGNOSIS — K219 Gastro-esophageal reflux disease without esophagitis: Secondary | ICD-10-CM

## 2021-12-17 NOTE — Progress Notes (Signed)
? ?Subjective:  ?Patient ID: Alicia Beltran, female    DOB: [birth year]  Age: 36 y.o. MRN: [id number] ? ?CC:  ?Chief Complaint  ?Patient presents with  ? Gastroesophageal Reflux  ?  Pt reports did finish carafate, and is scheduled for endoscopy on may 11th no concerns today   ? Anxiety  ?  Recent refill was given, reports this is still working well no concerns   ? Results  ?  Discuss lab results   ? ? ?HPI ?Alicia Beltran presents for  ? ?GERD: ?With nausea, intermittent abdominal pain discuss at March 27 visit.  Reassuring EKG at that time.  Suspected GERD, esophagitis.  PPI was increased to twice daily dosing, added Carafate and advised to avoid NSAIDs.  Referred to gastroenterology, seen August 6.   endoscopy scheduled May 11. ?Discomfort better with BID omeprazole. No dark stools. Also on miralax for constipation. BM daily. No hard stools.  ?No further chest pain.  ? ?Anxiety ?Started Zoloft, 25 mg daily with melatonin as option for insomnia.  ?Anxiety better on zoloft.  ?No meds for sleep. Sleeping better. Taking zoloft at night as foggy in am with am dosing. Feels rested in the morning.  ? ? ?  11/30/2021  ?  9:20 AM  ?Depression screen PHQ 2/9  ?Decreased Interest 0  ?Down, Depressed, Hopeless 0  ?PHQ - 2 Score 0  ? ? ?Hyperlipidemia: ?Mild elevation of LDL, low HDL. Plan for diet/exercise approach. Other labs reviewed without questions.  ?Lab Results  ?Component Value Date  ? CHOL 179 11/30/2021  ? HDL 35.60 (L) 11/30/2021  ? LDLCALC 115 (H) 11/30/2021  ? TRIG 140.0 11/30/2021  ? CHOLHDL 5 11/30/2021  ? ?Lab Results  ?Component Value Date  ? ALT 18 11/30/2021  ? AST 15 11/30/2021  ? ALKPHOS 62 11/30/2021  ? BILITOT 0.5 11/30/2021  ? ? ?History ?There are no problems to display for this patient. ? ?Past Medical History:  ?Diagnosis Date  ? Allergies   ? Anal fissure   ? Anxiety   ? Depression   ? GERD (gastroesophageal reflux disease)   ? Headache   ? ?Past Surgical History:  ?Procedure Laterality  Date  ? HEMORROIDECTOMY  2010  ? LAPAROSCOPY ABDOMEN DIAGNOSTIC    ? LUMBAR LAMINECTOMY/DECOMPRESSION MICRODISCECTOMY Right 09/05/2019  ? Procedure: Right Lumbar five through Sacral one disectomy SAME DAY DISCHARGE;  Surgeon: Venita Lick, MD;  Location: [facility];  Service: Orthopedics;  Laterality: Right;  2.5 hrs  ? ?No Known Allergies ?Prior to Admission medications   ?Medication Sig Start Date End Date Taking? Authorizing Provider  ?fluticasone (FLONASE) 50 MCG/ACT nasal spray Place 2 sprays into both nostrils daily. 09/19/21  Yes Claiborne Rigg, NP  ?omeprazole (PRILOSEC) 20 MG capsule Take 20 mg by mouth daily.   Yes [provider]  ?sertraline (ZOLOFT) 25 MG tablet Take 1 tablet (25 mg total) by mouth daily. 11/30/21  Yes Shade Flood, MD  ? ?Social History  ? ?Socioeconomic History  ? Marital status: Married  ?  Spouse name: Not on file  ? Number of children: 0  ? Years of education: Not on file  ? Highest education level: Not on file  ?Occupational History  ? Occupation: CNA  ?Tobacco Use  ? Smoking status: Never  ? Smokeless tobacco: Never  ?Vaping Use  ? Vaping Use: Never used  ?Substance and Sexual Activity  ? Alcohol use: Not Currently  ? Drug use: No  ? Sexual activity:  Yes  ?  Partners: Male  ?  Birth control/protection: None  ?Other Topics Concern  ? Not on file  ?Social History Narrative  ? Not on file  ? ?Social Determinants of Health  ? ?Financial Resource Strain: Not on file  ?Food Insecurity: Not on file  ?Transportation Needs: Not on file  ?Physical Activity: Not on file  ?Stress: Not on file  ?Social Connections: Not on file  ?Intimate Partner Violence: Not on file  ? ? ?Review of Systems ?Per HPI.  ? ?Objective:  ? ?Vitals:  ? 12/17/21 1119  ?BP: 126/74  ?Pulse: 71  ?Resp: 16  ?Temp: 98.2 ?F (36.8 ?C)  ?TempSrc: Temporal  ?SpO2: 100%  ?Weight: 202 lb 3.2 oz (91.7 kg)  ?Height: 5\' 7"  (1.702 m)  ? ? ? ?Physical Exam ?Constitutional:   ?   Appearance: She is well-developed.  ?HENT:   ?   Head: Normocephalic and atraumatic.  ?   Right Ear: External ear normal.  ?   Left Ear: External ear normal.  ?Eyes:  ?   Conjunctiva/sclera: Conjunctivae normal.  ?   Pupils: Pupils are equal, round, and reactive to light.  ?Neck:  ?   Thyroid: No thyromegaly.  ?Cardiovascular:  ?   Rate and Rhythm: Normal rate and regular rhythm.  ?   Heart sounds: Normal heart sounds. No murmur heard. ?Pulmonary:  ?   Effort: Pulmonary effort is normal. No respiratory distress.  ?   Breath sounds: Normal breath sounds. No wheezing.  ?Abdominal:  ?   General: Bowel sounds are normal.  ?   Palpations: Abdomen is soft.  ?   Tenderness: There is no abdominal tenderness.  ?Musculoskeletal:     ?   General: No tenderness. Normal range of motion.  ?   Cervical back: Normal range of motion and neck supple.  ?Lymphadenopathy:  ?   Cervical: No cervical adenopathy.  ?Skin: ?   General: Skin is warm and dry.  ?   Findings: No rash.  ?Neurological:  ?   Mental Status: She is alert and oriented to person, place, and time.  ?Psychiatric:     ?   Behavior: Behavior normal.     ?   Thought Content: Thought content normal.  ? ? ? ? ? ?Assessment & Plan:  ?Alicia Beltran is a 36 y.o. female . ?Anxiety ? - improved. Considering increase to 50mg  dose zoloft in next few weeks. Can double 25mg  dose and let me know if that change to send in 50mg  dose. Sleep improved - no new meds for now.  ? ?Gastroesophageal reflux disease, unspecified whether esophagitis present  ?Intermittent abdominal pain ?Chest pain, unspecified type ? - improved. Still some episodic sx's. Continue BID PPI, plan for EGD as above. RTC precautions. Now on miralax as well, denies hard stools, straining recently.  ? ?Diet/exercise approach for HLD. ? ?No orders of the defined types were placed in this encounter. ? ?Patient Instructions  ?If needed can increase to 2 zoloft per day if anxiety symptoms not further improved in next 3 weeks. If that dose is better, let me know  and I can send in the 50mg  dose.  ? ? ? ?Signed,  ? ?30, MD ?[facility] ?[facility] ?12/17/21 ?11:24 PM ? ? ?

## 2021-12-17 NOTE — Patient Instructions (Addendum)
If needed can increase to 2 zoloft per day if anxiety symptoms not further improved in next 3 weeks. If that dose is better, let me know and I can send in the 50mg  dose.  ? ? ? ? ?

## 2021-12-28 ENCOUNTER — Other Ambulatory Visit (HOSPITAL_COMMUNITY): Payer: Self-pay

## 2021-12-28 ENCOUNTER — Encounter: Payer: Self-pay | Admitting: Family Medicine

## 2021-12-28 DIAGNOSIS — F5104 Psychophysiologic insomnia: Secondary | ICD-10-CM

## 2021-12-28 DIAGNOSIS — F419 Anxiety disorder, unspecified: Secondary | ICD-10-CM

## 2021-12-28 MED ORDER — SERTRALINE HCL 50 MG PO TABS
50.0000 mg | ORAL_TABLET | Freq: Every day | ORAL | 1 refills | Status: DC
  Filled 2021-12-28: qty 90, 90d supply, fill #0
  Filled 2022-03-25: qty 90, 90d supply, fill #1

## 2021-12-29 ENCOUNTER — Other Ambulatory Visit (HOSPITAL_COMMUNITY): Payer: Self-pay

## 2022-01-14 ENCOUNTER — Ambulatory Visit (AMBULATORY_SURGERY_CENTER): Payer: 59 | Admitting: Internal Medicine

## 2022-01-14 ENCOUNTER — Encounter: Payer: Self-pay | Admitting: Internal Medicine

## 2022-01-14 ENCOUNTER — Other Ambulatory Visit (HOSPITAL_COMMUNITY): Payer: Self-pay

## 2022-01-14 VITALS — BP 137/82 | HR 69 | Temp 98.7°F | Resp 16 | Ht 67.0 in | Wt 200.0 lb

## 2022-01-14 DIAGNOSIS — K2970 Gastritis, unspecified, without bleeding: Secondary | ICD-10-CM | POA: Diagnosis not present

## 2022-01-14 DIAGNOSIS — R140 Abdominal distension (gaseous): Secondary | ICD-10-CM

## 2022-01-14 DIAGNOSIS — R194 Change in bowel habit: Secondary | ICD-10-CM | POA: Diagnosis not present

## 2022-01-14 DIAGNOSIS — K31A0 Gastric intestinal metaplasia, unspecified: Secondary | ICD-10-CM | POA: Diagnosis not present

## 2022-01-14 DIAGNOSIS — R12 Heartburn: Secondary | ICD-10-CM | POA: Diagnosis not present

## 2022-01-14 DIAGNOSIS — R1310 Dysphagia, unspecified: Secondary | ICD-10-CM

## 2022-01-14 DIAGNOSIS — K219 Gastro-esophageal reflux disease without esophagitis: Secondary | ICD-10-CM | POA: Diagnosis not present

## 2022-01-14 DIAGNOSIS — K319 Disease of stomach and duodenum, unspecified: Secondary | ICD-10-CM | POA: Diagnosis not present

## 2022-01-14 MED ORDER — SODIUM CHLORIDE 0.9 % IV SOLN: 500.0000 mL | INTRAVENOUS | Status: DC

## 2022-01-14 MED ORDER — OMEPRAZOLE 40 MG PO CPDR
40.0000 mg | DELAYED_RELEASE_CAPSULE | Freq: Two times a day (BID) | ORAL | 0 refills | Status: DC
  Filled 2022-01-14: qty 120, 60d supply, fill #0

## 2022-01-14 NOTE — Op Note (Signed)
[facility] ?Patient Name: Alicia Beltran ?Procedure Date: 01/14/2022 10:31 AM ?MRN: [id number] ?Endoscopist: Sonny Masters "Christia Reading ,  ?Age: 36 ?Referring MD:  ?Date of Birth: [birth year] ?Gender: Female ?Account #: [id number] ?Procedure:                Upper GI endoscopy ?Indications:              Dysphagia, Heartburn, Abdominal bloating,  ?                          alternating constipation and diarrhea ?Medicines:                Monitored Anesthesia Care ?Procedure:                Pre-Anesthesia Assessment: ?                          - Prior to the procedure, a History and Physical  ?                          was performed, and patient medications and  ?                          allergies were reviewed. The patient's tolerance of  ?                          previous anesthesia was also reviewed. The risks  ?                          and benefits of the procedure and the sedation  ?                          options and risks were discussed with the patient.  ?                          All questions were answered, and informed consent  ?                          was obtained. Prior Anticoagulants: The patient has  ?                          taken no previous anticoagulant or antiplatelet  ?                          agents. ASA Grade Assessment: II - A patient with  ?                          mild systemic disease. After reviewing the risks  ?                          and benefits, the patient was deemed in  ?                          satisfactory condition to undergo the procedure. ?  After obtaining informed consent, the endoscope was  ?                          passed under direct vision. Throughout the  ?                          procedure, the patient's blood pressure, pulse, and  ?                          oxygen saturations were monitored continuously. The  ?                          GIF [id number] was introduced through the  ?                          mouth, and advanced  to the second part of duodenum.  ?                          The upper GI endoscopy was accomplished without  ?                          difficulty. The patient tolerated the procedure  ?                          well. ?Scope In: ?Scope Out: ?Findings:                 The examined esophagus was normal. Biopsies were  ?                          taken with a cold forceps for histology. ?                          Localized mild inflammation characterized by  ?                          congestion (edema) and erythema was found in the  ?                          gastric antrum. Biopsies were taken with a cold  ?                          forceps for histology. ?                          The examined duodenum was normal. Biopsies for  ?                          histology were taken with a cold forceps for  ?                          evaluation of celiac disease. ?Complications:            No immediate complications. ?Estimated Blood Loss:     Estimated blood loss was minimal. ?Impression:               -  Normal esophagus. Biopsied. ?                          - Gastritis. Biopsied. ?                          - Normal examined duodenum. Biopsied. ?Recommendation:           - Discharge patient to home (with escort). ?                          - Await pathology results. ?                          - Use Prilosec (omeprazole) 40 mg PO BID for 8  ?                          weeks. ?                          - Return to GI clinic in 2 months. ?Georgian Co,  ?01/14/2022 10:58:05 AM ?

## 2022-01-14 NOTE — Progress Notes (Signed)
Pt non-responsive, VVS, Report to RN  °

## 2022-01-14 NOTE — Patient Instructions (Signed)
Take your omeprazole twice daily on an empty stomach for 8 weeks.  You have an appointment with  ?Dr. Lorenso Courier on July 12 th at 10:20.  If you can't make the appointment, call the office. ? ?YOU HAD AN ENDOSCOPIC PROCEDURE TODAY AT [facility]:   Refer to the procedure report that was given to you for any specific questions about what was found during the examination.  If the procedure report does not answer your questions, please call your gastroenterologist to clarify.  If you requested that your care partner not be given the details of your procedure findings, then the procedure report has been included in a sealed envelope for you to review at your convenience later. ? ?YOU SHOULD EXPECT: Some feelings of bloating in the abdomen. Passage of more gas than usual.  Walking can help get rid of the air that was put into your GI tract during the procedure and reduce the bloating. ? ?Please Note:  You might notice some irritation and congestion in your nose or some drainage.  This is from the oxygen used during your procedure.  There is no need for concern and it should clear up in a day or so. ? ?SYMPTOMS TO REPORT IMMEDIATELY: ? ? Fever of 100?F or higher ? ?Following upper endoscopy (EGD) ? Vomiting of blood or coffee ground material ? New chest pain or pain under the shoulder blades ? Painful or persistently difficult swallowing ? New shortness of breath ? Fever of 100?F or higher ? Black, tarry-looking stools ? ?For urgent or emergent issues, a gastroenterologist can be reached at any hour by calling [phone]. ?Do not use MyChart messaging for urgent concerns.  ? ? ?DIET:  We do recommend a small meal at first, but then you may proceed to your regular diet.  Drink plenty of fluids but you should avoid alcoholic beverages for 24 hours. ? ?ACTIVITY:  You should plan to take it easy for the rest of today and you should NOT DRIVE or use heavy machinery until tomorrow (because of the sedation  medicines used during the test).   ? ?FOLLOW UP: ?Our staff will call the number listed on your records 48-72 hours following your procedure to check on you and address any questions or concerns that you may have regarding the information given to you following your procedure. If we do not reach you, we will leave a message.  We will attempt to reach you two times.  During this call, we will ask if you have developed any symptoms of COVID 19. If you develop any symptoms (ie: fever, flu-like symptoms, shortness of breath, cough etc.) before then, please call [phone].  If you test positive for Covid 19 in the 2 weeks post procedure, please call and report this information to [place].   ? ?If any biopsies were taken you will be contacted by phone or by letter within the next 1-3 weeks.  Please call us at [phone] if you have not heard about the biopsies in 3 weeks.  ? ? ?SIGNATURES/CONFIDENTIALITY: ?You and/or your care partner have signed paperwork which will be entered into your electronic medical record.  These signatures attest to the fact that that the information above on your After Visit Summary has been reviewed and is understood.  Full responsibility of the confidentiality of this discharge information lies with you and/or your care-partner.  ?

## 2022-01-14 NOTE — Progress Notes (Signed)
Pt's states no medical or surgical changes since previsit or office visit. 

## 2022-01-14 NOTE — Progress Notes (Signed)
? ?GASTROENTEROLOGY PROCEDURE H&P NOTE  ? ?Primary Care Physician: ?Shade Flood, MD ? ? ? ?Reason for Procedure:   Dysphagia, GERD ? ?Plan:    EGD ? ?Patient is appropriate for endoscopic procedure(s) in the ambulatory ([facility]) setting. ? ?The nature of the procedure, as well as the risks, benefits, and alternatives were carefully and thoroughly reviewed with the patient. Ample time for discussion and questions allowed. The patient understood, was satisfied, and agreed to proceed.  ? ? ? ?HPI: ?Alicia Beltran is a 36 y.o. female who presents for EGD for evaluation of dysphagia and GERD .  Patient was most recently seen in the Gastroenterology Clinic on 12/10/21.  No interval change in medical history since that appointment. Please refer to that note for full details regarding GI history and clinical presentation.  ? ?Past Medical History:  ?Diagnosis Date  ? Allergies   ? Anal fissure   ? Anxiety   ? Depression   ? GERD (gastroesophageal reflux disease)   ? Headache   ? ? ?Past Surgical History:  ?Procedure Laterality Date  ? HEMORROIDECTOMY  2010  ? LAPAROSCOPY ABDOMEN DIAGNOSTIC    ? LUMBAR LAMINECTOMY/DECOMPRESSION MICRODISCECTOMY Right 09/05/2019  ? Procedure: Right Lumbar five through Sacral one disectomy SAME DAY DISCHARGE;  Surgeon: Venita Lick, MD;  Location: [facility] OR;  Service: Orthopedics;  Laterality: Right;  2.5 hrs  ? ? ?Prior to Admission medications   ?Medication Sig Start Date End Date Taking? Authorizing Provider  ?fluticasone (FLONASE) 50 MCG/ACT nasal spray Place 2 sprays into both nostrils daily. 09/19/21  Yes Claiborne Rigg, NP  ?omeprazole (PRILOSEC) 20 MG capsule Take 20 mg by mouth daily.   Yes [provider]  ?sertraline (ZOLOFT) 50 MG tablet Take 1 tablet by mouth daily. 12/28/21  Yes Shade Flood, MD  ? ? ?Current Outpatient Medications  ?Medication Sig Dispense Refill  ? fluticasone (FLONASE) 50 MCG/ACT nasal spray Place 2 sprays into both nostrils daily. 16 g 0   ? omeprazole (PRILOSEC) 20 MG capsule Take 20 mg by mouth daily.    ? sertraline (ZOLOFT) 50 MG tablet Take 1 tablet by mouth daily. 90 tablet 1  ? ?Current Facility-Administered Medications  ?Medication Dose Route Frequency Provider Last Rate Last Admin  ? 0.9 %  sodium chloride infusion  500 mL Intravenous Continuous Imogene Burn, MD      ? ? ?Allergies as of 01/14/2022  ? (No Known Allergies)  ? ? ?Family History  ?Problem Relation Age of Onset  ? Arthritis Mother   ? Depression Mother   ? Hypertension Mother   ? Thyroid disease Mother   ? Hyperlipidemia Father   ? Hypertension Father   ? Early death Maternal Grandmother   ? Early death Maternal Grandfather   ? Colon cancer Neg Hx   ? Esophageal cancer Neg Hx   ? Stomach cancer Neg Hx   ? Rectal cancer Neg Hx   ? ? ?Social History  ? ?Socioeconomic History  ? Marital status: Married  ?  Spouse name: Not on file  ? Number of children: 0  ? Years of education: Not on file  ? Highest education level: Not on file  ?Occupational History  ? Occupation: CNA  ?Tobacco Use  ? Smoking status: Never  ? Smokeless tobacco: Never  ?Vaping Use  ? Vaping Use: Never used  ?Substance and Sexual Activity  ? Alcohol use: Not Currently  ? Drug use: No  ? Sexual activity: Yes  ?  Partners: Male  ?  Birth control/protection: None  ?Other Topics Concern  ? Not on file  ?Social History Narrative  ? Not on file  ? ?Social Determinants of Health  ? ?Financial Resource Strain: Not on file  ?Food Insecurity: Not on file  ?Transportation Needs: Not on file  ?Physical Activity: Not on file  ?Stress: Not on file  ?Social Connections: Not on file  ?Intimate Partner Violence: Not on file  ? ? ?Physical Exam: ?Vital signs in last 24 hours: ?BP 130/84   Pulse 80   Temp 98.7 ?F (37.1 ?C) (Temporal)   Ht 5\' 7"  (1.702 m)   Wt 200 lb (90.7 kg)   SpO2 100%   BMI 31.32 kg/m?  ?GEN: NAD ?EYE: Sclerae anicteric ?ENT: MMM ?CV: Non-tachycardic ?Pulm: No increased WOB ?GI: Soft ?NEURO:  Alert &  Oriented ? ? ? , MD ?[facility] ? ? ?01/14/2022 10:32 AM ? ?

## 2022-01-14 NOTE — Progress Notes (Signed)
Called to room to assist during endoscopic procedure.  Patient ID and intended procedure confirmed with present staff. Received instructions for my participation in the procedure from the performing physician.  

## 2022-01-18 ENCOUNTER — Telehealth: Payer: Self-pay | Admitting: *Deleted

## 2022-01-18 ENCOUNTER — Encounter: Payer: Self-pay | Admitting: Internal Medicine

## 2022-01-18 NOTE — Telephone Encounter (Signed)
?  Follow up Call- ? ? ?  01/14/2022  ?  9:19 AM  ?Call back number  ?Post procedure Call Back phone  # [phone]  ?Permission to leave phone message Yes  ?  ? ?Patient questions: ? ?Do you have a fever, pain , or abdominal swelling? No. ?Pain Score  0 * ? ?Have you tolerated food without any problems? Yes.   ? ?Have you been able to return to your normal activities? Yes.   ? ?Do you have any questions about your discharge instructions: ?Diet   No. ?Medications  No. ?Follow up visit  No. ? ?Do you have questions or concerns about your Care? No. ? ?Actions: ?* If pain score is 4 or above: ?No action needed, pain <4. ? ? ?

## 2022-03-17 ENCOUNTER — Ambulatory Visit: Payer: 59 | Admitting: Internal Medicine

## 2022-03-17 ENCOUNTER — Other Ambulatory Visit (HOSPITAL_COMMUNITY): Payer: Self-pay

## 2022-03-17 ENCOUNTER — Encounter: Payer: Self-pay | Admitting: Internal Medicine

## 2022-03-17 VITALS — BP 124/76 | HR 83 | Ht 67.0 in | Wt 206.0 lb

## 2022-03-17 DIAGNOSIS — K2970 Gastritis, unspecified, without bleeding: Secondary | ICD-10-CM | POA: Diagnosis not present

## 2022-03-17 DIAGNOSIS — R1310 Dysphagia, unspecified: Secondary | ICD-10-CM

## 2022-03-17 DIAGNOSIS — R0989 Other specified symptoms and signs involving the circulatory and respiratory systems: Secondary | ICD-10-CM | POA: Diagnosis not present

## 2022-03-17 DIAGNOSIS — R1084 Generalized abdominal pain: Secondary | ICD-10-CM | POA: Diagnosis not present

## 2022-03-17 DIAGNOSIS — K5900 Constipation, unspecified: Secondary | ICD-10-CM | POA: Diagnosis not present

## 2022-03-17 DIAGNOSIS — R140 Abdominal distension (gaseous): Secondary | ICD-10-CM

## 2022-03-17 DIAGNOSIS — K219 Gastro-esophageal reflux disease without esophagitis: Secondary | ICD-10-CM

## 2022-03-17 DIAGNOSIS — K2990 Gastroduodenitis, unspecified, without bleeding: Secondary | ICD-10-CM | POA: Diagnosis not present

## 2022-03-17 MED ORDER — OMEPRAZOLE 40 MG PO CPDR
40.0000 mg | DELAYED_RELEASE_CAPSULE | Freq: Two times a day (BID) | ORAL | 3 refills | Status: DC
  Filled 2022-03-17: qty 60, 30d supply, fill #0
  Filled 2022-04-22: qty 60, 30d supply, fill #1
  Filled 2022-06-22: qty 60, 30d supply, fill #2
  Filled 2022-07-28: qty 60, 30d supply, fill #3

## 2022-03-17 MED ORDER — FAMOTIDINE 20 MG PO TABS
20.0000 mg | ORAL_TABLET | Freq: Every day | ORAL | 3 refills | Status: DC
  Filled 2022-03-17: qty 30, 30d supply, fill #0
  Filled 2022-04-12: qty 30, 30d supply, fill #1
  Filled 2022-06-07: qty 30, 30d supply, fill #2
  Filled 2022-07-28: qty 30, 30d supply, fill #3

## 2022-03-17 MED ORDER — LINACLOTIDE 72 MCG PO CAPS
72.0000 ug | ORAL_CAPSULE | Freq: Every day | ORAL | 3 refills | Status: DC
  Filled 2022-03-17: qty 30, 30d supply, fill #0

## 2022-03-17 NOTE — Patient Instructions (Addendum)
Start Linzess 72 mcg daily.  Start Pepcid 20 mg every night at bedtime.   If you are age 38 or older, your body mass index should be between 23-30. Your Body mass index is 32.26 kg/m. If this is out of the aforementioned range listed, please consider follow up with your Primary Care Provider.  If you are age 42 or younger, your body mass index should be between 19-25. Your Body mass index is 32.26 kg/m. If this is out of the aformentioned range listed, please consider follow up with your Primary Care Provider.   ________________________________________________________  The [facility] providers would like to encourage you to use [facility] to communicate with providers for non-urgent requests or questions.  Due to long hold times on the telephone, sending your provider a message by [facility] may be a faster and more efficient way to get a response.  Please allow 48 business hours for a response.  Please remember that this is for non-urgent requests.  _______________________________________________________  Start Pepcid 20 mg at bedtime.  We have refilled your omeprazole.  Please follow up in 2 months. Please call [phone] to schedule.  We have given you samples of the following medication to take: Linzess . Take 1 a day with a meal.  Drink 8 cups of water a day and walk 30 minutes a day.  Please purchase the following medications over the counter and take as directed:  Fiber supplement such as Benefiber- use as directed daily  It was a pleasure to see you today!  Thank you for trusting me with your gastrointestinal care!

## 2022-03-17 NOTE — Progress Notes (Signed)
Chief Complaint: Abdominal discomfort  HPI : 36 year old female with history of GERD, anal fissure, headache presents for follow up of abdominal discomfort  She works as a Lawyer at [employer].    Interval History: She has still been having some abdominal discomfort. She is taking the omeprazole, which has helped with the burning in her chest and the sensation of something in her throat. She does still feel like there is stuff in her throat. She has been taking her Miralax QD, and now she is having about 4-5 BMs per day. Certain foods will cause her to go to the bathroom immediately to have a BM. She has tried to avoid NSAIDs and is now taking occasional Tylenol. Dysphagia is better.  Current Outpatient Medications  Medication Sig Dispense Refill   fluticasone (FLONASE) 50 MCG/ACT nasal spray Place 2 sprays into both nostrils daily. 16 g 0   omeprazole (PRILOSEC) 40 MG capsule Take 1 capsule by mouth 2 times daily before a meal. 120 capsule 0   polyethylene glycol (MIRALAX / GLYCOLAX) 17 g packet Take 17 g by mouth daily.     sertraline (ZOLOFT) 50 MG tablet Take 1 tablet by mouth daily. 90 tablet 1   No current facility-administered medications for this visit.   Review of Systems: All systems reviewed and negative except where noted in HPI.   Physical Exam: BP 124/76   Pulse 83   Ht 5\' 7"  (1.702 m)   Wt 206 lb (93.4 kg)   SpO2 99%   BMI 32.26 kg/m  Constitutional: Pleasant,well-developed, female in no acute distress. HEENT: Normocephalic and atraumatic. Conjunctivae are normal. No scleral icterus. Cardiovascular: Normal rate, regular rhythm.  Pulmonary/chest: Effort normal and breath sounds normal. No wheezing, rales or rhonchi. Abdominal: Soft, nondistended, nontender. Bowel sounds active throughout. There are no masses palpable. No hepatomegaly. Extremities: No edema Neurological: Alert and oriented to person place and time. Skin: Skin is warm and dry. No rashes  noted. Psychiatric: Normal mood and affect. Behavior is normal.  Labs 11/2021: CMP unremarkable, CBC nml. TSH nml.  CT A/P w/contrast 10/2010: IMPRESSION:  1.  No acute abdominal pelvic findings identified.  No evidence of bowel obstruction or appendicitis.  2.  Prominent perirectal fat, similar to the prior examination.  3.  Slightly enlarged cystic lesion in the lower pole of the right  kidney now appears slightly septated.  A cyst has been identified in this area on prior ultrasound.   KUB 12/11/21: IMPRESSION: 1. Stool throughout the majority of the colon-likely constipation. 2. Nonobstructive bowel gas pattern.  EGD 5/11/2: - Normal esophagus. Biopsied. - Gastritis. Biopsied. - Normal examined duodenum. Biopsied. Path: 1. Surgical [P], duodenal biopsies - SMALL INTESTINAL MUCOSA WITH NO SPECIFIC PATHOLOGIC DIAGNOSIS. - NORMAL LONG AND SLENDER VILLI, NEGATIVE FOR PATHOLOGIC INTRAEPITHELIAL LYMPHOCYTOSIS. 2. Surgical [P], gastric biopsies - MILD REACTIVE GASTROPATHY. - NEGATIVE FOR AN INFLAMMATORY PATTERN PREDICTIVE OF HELICOBACTER PYLORI INFECTION. - NEGATIVE FOR INTESTINAL METAPLASIA AND MALIGNANCY. 3. Surgical [P], esophageal biopsies - SQUAMOUS MUCOSA WITH NO SPECIFIC PATHOLOGIC DIAGNOSIS. - NEGATIVE FOR ACTIVE ESOPHAGITIS AND INTRASQUAMOUS EOSINOPHILS. - NO VIRAL CYTOPATHIC CHANGE OR FUNGI IDENTIFIED (ON H&E). - NEGATIVE FOR DYSPLASIA AND MALIGNANCY. - NO GLANDULAR MUCOSA PRESENT.  ASSESSMENT AND PLAN: GERD Gastritis Globus Generalized abdominal discomfort Bloating Constipation Patient presents for follow up of abdominal discomfort. She has found some benefit from both PPI BID and Miralax QD. Her last KUB showed that the patient had substantial stool burden, which suggests that  constipation could be contributing to her abdominal discomfort and bloating. Will give her a trial of Linzess to see if this helps more than the Miralax medication. Will also start her on Pepcid  QHS since patient does describe some night-time globus that may be due to uncontrolled GERD.  - Low FODMAP diet - Cont omeprazole 40 mg BID - Start Pepcid 20 mg QHS - Drink 8 cups water per day, get 30 min of physical activity, daily fiber supplement - Start Linzess 72 mcg QD - RTC 2 months  Eulah Pont, MD

## 2022-03-24 ENCOUNTER — Ambulatory Visit: Payer: 59 | Admitting: Family Medicine

## 2022-03-24 ENCOUNTER — Other Ambulatory Visit (HOSPITAL_COMMUNITY): Payer: Self-pay

## 2022-03-24 ENCOUNTER — Encounter: Payer: Self-pay | Admitting: Family Medicine

## 2022-03-24 VITALS — BP 114/62 | HR 84 | Temp 98.2°F | Resp 19 | Ht 67.0 in | Wt 204.2 lb

## 2022-03-24 DIAGNOSIS — F419 Anxiety disorder, unspecified: Secondary | ICD-10-CM | POA: Diagnosis not present

## 2022-03-24 DIAGNOSIS — H5713 Ocular pain, bilateral: Secondary | ICD-10-CM

## 2022-03-24 DIAGNOSIS — R519 Headache, unspecified: Secondary | ICD-10-CM | POA: Diagnosis not present

## 2022-03-24 MED ORDER — SUMATRIPTAN SUCCINATE 25 MG PO TABS
25.0000 mg | ORAL_TABLET | ORAL | 0 refills | Status: AC | PRN
  Filled 2022-03-24: qty 10, 30d supply, fill #0

## 2022-03-24 NOTE — Patient Instructions (Addendum)
Try increasing the flonase to daily use, and saline nasal spray if needed for congestion or dry nasal passages. Increase fluid intake, see info on headaches below. May be a combo of migraine, allergy/sinus, and tension.  Call Dr. Shea Evans for evaluation of your eyes and pressure to make sure not contributing to headache.  Imitrex for severe headache - can repeat in 2 hours once if needed.  I will order ct scan but expect that to be ok.  I will refer you to headache specialist.  Return to the clinic or go to the nearest emergency room if any of your symptoms worsen or new symptoms occur.  Migraine Headache A migraine headache is an intense, throbbing pain on one side or both sides of the head. Migraine headaches may also cause other symptoms, such as nausea, vomiting, and sensitivity to light and noise. A migraine headache can last from 4 hours to 3 days. Talk with your doctor about what things may bring on (trigger) your migraine headaches. What are the causes? The exact cause of this condition is not known. However, a migraine may be caused when nerves in the brain become irritated and release chemicals that cause inflammation of blood vessels. This inflammation causes pain. This condition may be triggered or caused by: Drinking alcohol. Smoking. Taking medicines, such as: Medicine used to treat chest pain (nitroglycerin). Birth control pills. Estrogen. Certain blood pressure medicines. Eating or drinking products that contain nitrates, glutamate, aspartame, or tyramine. Aged cheeses, chocolate, or caffeine may also be triggers. Doing physical activity. Other things that may trigger a migraine headache include: Menstruation. Pregnancy. Hunger. Stress. Lack of sleep or too much sleep. Weather changes. Fatigue. What increases the risk? The following factors may make you more likely to experience migraine headaches: Being a certain age. This condition is more common in people who are 15-37  years old. Being female. Having a family history of migraine headaches. Being Caucasian. Having a mental health condition, such as depression or anxiety. Being obese. What are the signs or symptoms? The main symptom of this condition is pulsating or throbbing pain. This pain may: Happen in any area of the head, such as on one side or both sides. Interfere with daily activities. Get worse with physical activity. Get worse with exposure to bright lights or loud noises. Other symptoms may include: Nausea. Vomiting. Dizziness. General sensitivity to bright lights, loud noises, or smells. Before you get a migraine headache, you may get warning signs (an aura). An aura may include: Seeing flashing lights or having blind spots. Seeing bright spots, halos, or zigzag lines. Having tunnel vision or blurred vision. Having numbness or a tingling feeling. Having trouble talking. Having muscle weakness. Some people have symptoms after a migraine headache (postdromal phase), such as: Feeling tired. Difficulty concentrating. How is this diagnosed? A migraine headache can be diagnosed based on: Your symptoms. A physical exam. Tests, such as: CT scan or an MRI of the head. These imaging tests can help rule out other causes of headaches. Taking fluid from the spine (lumbar puncture) and analyzing it (cerebrospinal fluid analysis, or CSF analysis). How is this treated? This condition may be treated with medicines that: Relieve pain. Relieve nausea. Prevent migraine headaches. Treatment for this condition may also include: Acupuncture. Lifestyle changes like avoiding foods that trigger migraine headaches. Biofeedback. Cognitive behavioral therapy. Follow these instructions at home: Medicines Take over-the-counter and prescription medicines only as told by your health care provider. Ask your health care provider if the medicine  prescribed to you: Requires you to avoid driving or using heavy  machinery. Can cause constipation. You may need to take these actions to prevent or treat constipation: Drink enough fluid to keep your urine pale yellow. Take over-the-counter or prescription medicines. Eat foods that are high in fiber, such as beans, whole grains, and fresh fruits and vegetables. Limit foods that are high in fat and processed sugars, such as fried or sweet foods. Lifestyle Do not drink alcohol. Do not use any products that contain nicotine or tobacco, such as cigarettes, e-cigarettes, and chewing tobacco. If you need help quitting, ask your health care provider. Get at least 8 hours of sleep every night. Find ways to manage stress, such as meditation, deep breathing, or yoga. General instructions     Keep a journal to find out what may trigger your migraine headaches. For example, write down: What you eat and drink. How much sleep you get. Any change to your diet or medicines. If you have a migraine headache: Avoid things that make your symptoms worse, such as bright lights. It may help to lie down in a dark, quiet room. Do not drive or use heavy machinery. Ask your health care provider what activities are safe for you while you are experiencing symptoms. Keep all follow-up visits as told by your health care provider. This is important. Contact a health care provider if: You develop symptoms that are different or more severe than your usual migraine headache symptoms. You have more than 15 headache days in one month. Get help right away if: Your migraine headache becomes severe. Your migraine headache lasts longer than 72 hours. You have a fever. You have a stiff neck. You have vision loss. Your muscles feel weak or like you cannot control them. You start to lose your balance often. You have trouble walking. You faint. You have a seizure. Summary A migraine headache is an intense, throbbing pain on one side or both sides of the head. Migraines may also cause  other symptoms, such as nausea, vomiting, and sensitivity to light and noise. This condition may be treated with medicines and lifestyle changes. You may also need to avoid certain things that trigger a migraine headache. Keep a journal to find out what may trigger your migraine headaches. Contact your health care provider if you have more than 15 headache days in a month or you develop symptoms that are different or more severe than your usual migraine headache symptoms. This information is not intended to replace advice given to you by your health care provider. Make sure you discuss any questions you have with your health care provider. Document Revised: 12/15/2018 Document Reviewed: 10/05/2018 [employer] Patient Education  2023 [employer]   General Headache Without Cause A headache is pain or discomfort felt around the head or neck area. There are many causes and types of headaches. A few common types include: Tension headaches. Migraine headaches. Cluster headaches. Chronic daily headaches. Sometimes, the specific cause of a headache may not be found. Follow these instructions at home: Watch your condition for any changes. Let your health care provider know about them. Take these steps to help with your condition: Managing pain     Take over-the-counter and prescription medicines only as told by your health care provider. Treatment may include medicines for pain that are taken by mouth or applied to the skin. Lie down in a dark, quiet room when you have a headache. Keep lights dim if bright lights bother you or  make your headaches worse. If directed, put ice on your head and neck area: Put ice in a plastic bag. Place a towel between your skin and the bag. Leave the ice on for 20 minutes, 2-3 times per day. Remove the ice if your skin turns bright red. This is very important. If you cannot feel pain, heat, or cold, you have a greater risk of damage to the area. If directed, apply  heat to the affected area. Use the heat source that your health care provider recommends, such as a moist heat pack or a heating pad. Place a towel between your skin and the heat source. Leave the heat on for 20-30 minutes. Remove the heat if your skin turns bright red. This is especially important if you are unable to feel pain, heat, or cold. You have a greater risk of getting burned. Eating and drinking Eat meals on a regular schedule. If you drink alcohol: Limit how much you have to: 0-1 drink a day for women who are not pregnant. 0-2 drinks a day for men. Know how much alcohol is in a drink. In the [place] one drink equals one 12 oz bottle of beer (355 mL), one 5 oz glass of wine (148 mL), or one 1 oz glass of hard liquor (44 mL). Stop drinking caffeine, or decrease the amount of caffeine you drink. Drink enough fluid to keep your urine pale yellow. General instructions  Keep a headache journal to help find out what may trigger your headaches. For example, write down: What you eat and drink. How much sleep you get. Any change to your diet or medicines. Try massage or other relaxation techniques. Limit stress. Sit up straight, and do not tense your muscles. Do not use any products that contain nicotine or tobacco. These products include cigarettes, chewing tobacco, and vaping devices, such as e-cigarettes. If you need help quitting, ask your health care provider. Exercise regularly as told by your health care provider. Sleep on a regular schedule. Get 7-9 hours of sleep each night, or the amount recommended by your health care provider. Keep all follow-up visits. This is important. Contact a health care provider if: Medicine does not help your symptoms. You have a headache that is different from your usual headache. You have nausea or you vomit. You have a fever. Get help right away if: Your headache: Becomes severe quickly. Gets worse after moderate to intense physical  activity. You have any of these symptoms: Repeated vomiting. Pain or stiffness in your neck. Changes to your vision. Pain in an eye or ear. Problems with speech. Muscular weakness or loss of muscle control. Loss of balance or coordination. You feel faint or pass out. You have confusion. You have a seizure. These symptoms may represent a serious problem that is an emergency. Do not wait to see if the symptoms will go away. Get medical help right away. Call your local emergency services (911 in the [place] Do not drive yourself to the hospital. Summary A headache is pain or discomfort felt around the head or neck area. There are many causes and types of headaches. In some cases, the cause may not be found. Keep a headache journal to help find out what may trigger your headaches. Watch your condition for any changes. Let your health care provider know about them. Contact a health care provider if you have a headache that is different from the usual headache, or if your symptoms are not helped by medicine. Get help right  away if your headache becomes severe, you vomit, you have a loss of vision, you lose your balance, or you have a seizure. This information is not intended to replace advice given to you by your health care provider. Make sure you discuss any questions you have with your health care provider. Document Revised: 01/21/2021 Document Reviewed: 01/21/2021 [employer] Patient Education  2023 [employer].

## 2022-03-24 NOTE — Progress Notes (Signed)
Subjective:  Patient ID: Alicia Beltran, female    DOB: 07-11-1986  Age: 36 y.o. MRN: [id number]  CC:  Chief Complaint  Patient presents with   Headache    Pt states being lightheaded at times and light sensitivity at times, also notes increased frequency     HPI Alicia Beltran presents for   Headaches, lightheadedness HA on and off for entire life but more frequent past 6 months.  Now 2-3 times per week. Still able to work, just does not feel well. Some nausea at times if occipital.  Woke up out of sleep few months ago.  No focal weakness, numbness.  Sometimes occipital, sometimes left temple, other times R temple.  Pain behind eyes at times. No vision changes or darkening of vision.  Has not discussed with her eye provider. No recent appt.  No rash on scalp. Sometimes light sensitivity with more severe HA. No noise sensitivity, but sometimes feels a buzzing noise in ear. Not pulsatile. No fever/neck stiffness.  No aura, no known hx of migraine HA.  Pressure in back of head. Chronic nasal congestion - min relief with flonase - only using once per week.  Hx of anxiety, but not having stress or anxiety with HA. Zoloft increased to 50mg  in April - no change in Headache. Anxiety is better - still stressed/anxious at times.  Pressure in head at times. Sore at base of scalp, not neck.  Sleeping ok. Better now.  Drinking fluids throughout the day, ?32 ounces.  Caffeine - 2 drinks per day.  No heart disease hx.    Tx: hot compress. Avoiding nsaids d/t stomach issues - still takes on occasion - tylenol last weekend.     History There are no problems to display for this patient.  Past Medical History:  Diagnosis Date   Allergies    Anal fissure    Anxiety    Depression    GERD (gastroesophageal reflux disease)    Headache    Past Surgical History:  Procedure Laterality Date   HEMORROIDECTOMY  2010   LAPAROSCOPY ABDOMEN DIAGNOSTIC     LUMBAR  LAMINECTOMY/DECOMPRESSION MICRODISCECTOMY Right 09/05/2019   Procedure: Right Lumbar five through Sacral one disectomy SAME DAY DISCHARGE;  Surgeon: 09/07/2019, MD;  Location: [facility] OR;  Service: Orthopedics;  Laterality: Right;  2.5 hrs   No Known Allergies Prior to Admission medications   Medication Sig Start Date End Date Taking? Authorizing Provider  famotidine (PEPCID) 20 MG tablet Take 1 tablet (20 mg total) by mouth at bedtime. 03/17/22  Yes 05/18/22, MD  fluticasone (FLONASE) 50 MCG/ACT nasal spray Place 2 sprays into both nostrils daily. 09/19/21  Yes 09/21/21, NP  linaclotide [facility]) 72 MCG capsule Take 1 capsule (72 mcg total) by mouth daily before breakfast. 03/17/22  Yes 05/18/22, MD  omeprazole (PRILOSEC) 40 MG capsule Take 1 capsule by mouth 2 times daily before a meal. 03/17/22  Yes 05/18/22, MD  polyethylene glycol (MIRALAX / GLYCOLAX) 17 g packet Take 17 g by mouth daily.   Yes [provider]  sertraline (ZOLOFT) 50 MG tablet Take 1 tablet by mouth daily. 12/28/21  Yes 12/30/21, MD   Social History   Socioeconomic History   Marital status: Married    Spouse name: Not on file   Number of children: 0   Years of education: Not on file   Highest education level: Not on file  Occupational History  Occupation: CNA  Tobacco Use   Smoking status: Never   Smokeless tobacco: Never  Vaping Use   Vaping Use: Never used  Substance and Sexual Activity   Alcohol use: Not Currently   Drug use: No   Sexual activity: Yes    Partners: Male    Birth control/protection: None  Other Topics Concern   Not on file  Social History Narrative   Not on file   Social Determinants of Health   Financial Resource Strain: Not on file  Food Insecurity: Not on file  Transportation Needs: Not on file  Physical Activity: Not on file  Stress: Not on file  Social Connections: Not on file  Intimate Partner Violence: Not on file    Review of  Systems  Per HPI.  Objective:   Vitals:   03/24/22 1442  BP: 114/62  Pulse: 84  Resp: 19  Temp: 98.2 F (36.8 C)  TempSrc: Oral  SpO2: 97%  Weight: 204 lb 3.2 oz (92.6 kg)  Height: 5\' 7"  (1.702 m)     Physical Exam Vitals reviewed.  Constitutional:      Appearance: Normal appearance. She is well-developed.  HENT:     Head: Normocephalic and atraumatic.  Eyes:     Extraocular Movements: Extraocular movements intact.     Conjunctiva/sclera: Conjunctivae normal.     Pupils: Pupils are equal, round, and reactive to light.  Neck:     Vascular: No carotid bruit.  Cardiovascular:     Rate and Rhythm: Normal rate and regular rhythm.     Heart sounds: Normal heart sounds.  Pulmonary:     Effort: Pulmonary effort is normal.     Breath sounds: Normal breath sounds.  Abdominal:     Palpations: Abdomen is soft. There is no pulsatile mass.     Tenderness: There is no abdominal tenderness.  Musculoskeletal:     Right lower leg: No edema.     Left lower leg: No edema.  Skin:    General: Skin is warm and dry.  Neurological:     General: No focal deficit present.     Mental Status: She is alert and oriented to person, place, and time.     Comments: Nonfocal exam with upper extremity and lower extremity strength equal, no focal weakness.  No pronator drift.  Normal finger-nose, heel-to-shin, normal gait.  Negative Romberg. No rash or focal tenderness of scalp.  Sinuses nontender.  Psychiatric:        Mood and Affect: Mood normal.        Behavior: Behavior normal.      Assessment & Plan:  Alicia Beltran is a 36 y.o. female . Nonintractable episodic headache, unspecified headache type - Plan: CT HEAD WO CONTRAST (30), AMB referral to headache clinic, SUMAtriptan (IMITREX) 25 MG tablet  Anxiety - Plan: CT HEAD WO CONTRAST ( )  Eye pain, bilateral - Plan: CT HEAD WO CONTRAST ( )  Longstanding headaches with some recent worsening.  Nonfocal exam.  Differential  includes migraine versus tension versus occipital/cervicogenic headache.  Various areas as above.  Refer to headache specialist, Imitrex as option temporarily for possible migrainous headache, CT head ordered given change and increased frequency of headaches.  Recommended evaluation with ophthalmic provider given eye symptoms.  ER precautions, RTC precautions given. Additionally recommended increasing Flonase to daily use in case some of her symptoms may be due to allergies, along with saline nasal spray as needed for dry nasal passages.  Increase fluid intake recommended, handout  given on headaches.  Meds ordered this encounter  Medications   SUMAtriptan (IMITREX) 25 MG tablet    Sig: Take 1 tablet (25 mg total) by mouth every 2 (two) hours as needed for up to 2 doses for migraine. May repeat in 2 hours if headache persists or recurs. Do not exceed 2 doses in 1 day.    Dispense:  10 tablet    Refill:  0   Patient Instructions  Try increasing the flonase to daily use, and saline nasal spray if needed for congestion or dry nasal passages. Increase fluid intake, see info on headaches below. May be a combo of migraine, allergy/sinus, and tension.  Call Dr. Shea Evansunn for evaluation of your eyes and pressure to make sure not contributing to headache.  Imitrex for severe headache - can repeat in 2 hours once if needed.  I will order ct scan but expect that to be ok.  I will refer you to headache specialist.  Return to the clinic or go to the nearest emergency room if any of your symptoms worsen or new symptoms occur.  Migraine Headache A migraine headache is an intense, throbbing pain on one side or both sides of the head. Migraine headaches may also cause other symptoms, such as nausea, vomiting, and sensitivity to light and noise. A migraine headache can last from 4 hours to 3 days. Talk with your doctor about what things may bring on (trigger) your migraine headaches. What are the causes? The exact  cause of this condition is not known. However, a migraine may be caused when nerves in the brain become irritated and release chemicals that cause inflammation of blood vessels. This inflammation causes pain. This condition may be triggered or caused by: Drinking alcohol. Smoking. Taking medicines, such as: Medicine used to treat chest pain (nitroglycerin). Birth control pills. Estrogen. Certain blood pressure medicines. Eating or drinking products that contain nitrates, glutamate, aspartame, or tyramine. Aged cheeses, chocolate, or caffeine may also be triggers. Doing physical activity. Other things that may trigger a migraine headache include: Menstruation. Pregnancy. Hunger. Stress. Lack of sleep or too much sleep. Weather changes. Fatigue. What increases the risk? The following factors may make you more likely to experience migraine headaches: Being a certain age. This condition is more common in people who are 2725-460 years old. Being female. Having a family history of migraine headaches. Being Caucasian. Having a mental health condition, such as depression or anxiety. Being obese. What are the signs or symptoms? The main symptom of this condition is pulsating or throbbing pain. This pain may: Happen in any area of the head, such as on one side or both sides. Interfere with daily activities. Get worse with physical activity. Get worse with exposure to bright lights or loud noises. Other symptoms may include: Nausea. Vomiting. Dizziness. General sensitivity to bright lights, loud noises, or smells. Before you get a migraine headache, you may get warning signs (an aura). An aura may include: Seeing flashing lights or having blind spots. Seeing bright spots, halos, or zigzag lines. Having tunnel vision or blurred vision. Having numbness or a tingling feeling. Having trouble talking. Having muscle weakness. Some people have symptoms after a migraine headache (postdromal  phase), such as: Feeling tired. Difficulty concentrating. How is this diagnosed? A migraine headache can be diagnosed based on: Your symptoms. A physical exam. Tests, such as: CT scan or an MRI of the head. These imaging tests can help rule out other causes of headaches. Taking fluid from the  spine (lumbar puncture) and analyzing it (cerebrospinal fluid analysis, or CSF analysis). How is this treated? This condition may be treated with medicines that: Relieve pain. Relieve nausea. Prevent migraine headaches. Treatment for this condition may also include: Acupuncture. Lifestyle changes like avoiding foods that trigger migraine headaches. Biofeedback. Cognitive behavioral therapy. Follow these instructions at home: Medicines Take over-the-counter and prescription medicines only as told by your health care provider. Ask your health care provider if the medicine prescribed to you: Requires you to avoid driving or using heavy machinery. Can cause constipation. You may need to take these actions to prevent or treat constipation: Drink enough fluid to keep your urine pale yellow. Take over-the-counter or prescription medicines. Eat foods that are high in fiber, such as beans, whole grains, and fresh fruits and vegetables. Limit foods that are high in fat and processed sugars, such as fried or sweet foods. Lifestyle Do not drink alcohol. Do not use any products that contain nicotine or tobacco, such as cigarettes, e-cigarettes, and chewing tobacco. If you need help quitting, ask your health care provider. Get at least 8 hours of sleep every night. Find ways to manage stress, such as meditation, deep breathing, or yoga. General instructions     Keep a journal to find out what may trigger your migraine headaches. For example, write down: What you eat and drink. How much sleep you get. Any change to your diet or medicines. If you have a migraine headache: Avoid things that make your  symptoms worse, such as bright lights. It may help to lie down in a dark, quiet room. Do not drive or use heavy machinery. Ask your health care provider what activities are safe for you while you are experiencing symptoms. Keep all follow-up visits as told by your health care provider. This is important. Contact a health care provider if: You develop symptoms that are different or more severe than your usual migraine headache symptoms. You have more than 15 headache days in one month. Get help right away if: Your migraine headache becomes severe. Your migraine headache lasts longer than 72 hours. You have a fever. You have a stiff neck. You have vision loss. Your muscles feel weak or like you cannot control them. You start to lose your balance often. You have trouble walking. You faint. You have a seizure. Summary A migraine headache is an intense, throbbing pain on one side or both sides of the head. Migraines may also cause other symptoms, such as nausea, vomiting, and sensitivity to light and noise. This condition may be treated with medicines and lifestyle changes. You may also need to avoid certain things that trigger a migraine headache. Keep a journal to find out what may trigger your migraine headaches. Contact your health care provider if you have more than 15 headache days in a month or you develop symptoms that are different or more severe than your usual migraine headache symptoms. This information is not intended to replace advice given to you by your health care provider. Make sure you discuss any questions you have with your health care provider. Document Revised: 12/15/2018 Document Reviewed: 10/05/2018 Elsevier Patient Education  2023 Else[employer] Inc.   General Headache Without Cause A headache is pain or discomfort felt around the head or neck area. There are many causes and types of headaches. A few common types include: Tension headaches. Migraine headaches. Cluster  headaches. Chronic daily headaches. Sometimes, the specific cause of a headache may not be found. Follow these instructions at  home: Watch your condition for any changes. Let your health care provider know about them. Take these steps to help with your condition: Managing pain     Take over-the-counter and prescription medicines only as told by your health care provider. Treatment may include medicines for pain that are taken by mouth or applied to the skin. Lie down in a dark, quiet room when you have a headache. Keep lights dim if bright lights bother you or make your headaches worse. If directed, put ice on your head and neck area: Put ice in a plastic bag. Place a towel between your skin and the bag. Leave the ice on for 20 minutes, 2-3 times per day. Remove the ice if your skin turns bright red. This is very important. If you cannot feel pain, heat, or cold, you have a greater risk of damage to the area. If directed, apply heat to the affected area. Use the heat source that your health care provider recommends, such as a moist heat pack or a heating pad. Place a towel between your skin and the heat source. Leave the heat on for 20-30 minutes. Remove the heat if your skin turns bright red. This is especially important if you are unable to feel pain, heat, or cold. You have a greater risk of getting burned. Eating and drinking Eat meals on a regular schedule. If you drink alcohol: Limit how much you have to: 0-1 drink a day for women who are not pregnant. 0-2 drinks a day for men. Know how much alcohol is in a drink. In the [place] one drink equals one 12 oz bottle of beer (355 mL), one 5 oz glass of wine (148 mL), or one 1 oz glass of hard liquor (44 mL). Stop drinking caffeine, or decrease the amount of caffeine you drink. Drink enough fluid to keep your urine pale yellow. General instructions  Keep a headache journal to help find out what may trigger your headaches. For example,  write down: What you eat and drink. How much sleep you get. Any change to your diet or medicines. Try massage or other relaxation techniques. Limit stress. Sit up straight, and do not tense your muscles. Do not use any products that contain nicotine or tobacco. These products include cigarettes, chewing tobacco, and vaping devices, such as e-cigarettes. If you need help quitting, ask your health care provider. Exercise regularly as told by your health care provider. Sleep on a regular schedule. Get 7-9 hours of sleep each night, or the amount recommended by your health care provider. Keep all follow-up visits. This is important. Contact a health care provider if: Medicine does not help your symptoms. You have a headache that is different from your usual headache. You have nausea or you vomit. You have a fever. Get help right away if: Your headache: Becomes severe quickly. Gets worse after moderate to intense physical activity. You have any of these symptoms: Repeated vomiting. Pain or stiffness in your neck. Changes to your vision. Pain in an eye or ear. Problems with speech. Muscular weakness or loss of muscle control. Loss of balance or coordination. You feel faint or pass out. You have confusion. You have a seizure. These symptoms may represent a serious problem that is an emergency. Do not wait to see if the symptoms will go away. Get medical help right away. Call your local emergency services (911 in the [place] Do not drive yourself to the hospital. Summary A headache is pain or discomfort felt around  the head or neck area. There are many causes and types of headaches. In some cases, the cause may not be found. Keep a headache journal to help find out what may trigger your headaches. Watch your condition for any changes. Let your health care provider know about them. Contact a health care provider if you have a headache that is different from the usual headache, or if your  symptoms are not helped by medicine. Get help right away if your headache becomes severe, you vomit, you have a loss of vision, you lose your balance, or you have a seizure. This information is not intended to replace advice given to you by your health care provider. Make sure you discuss any questions you have with your health care provider. Document Revised: 01/21/2021 Document Reviewed: 01/21/2021 Elsevier Patient Education  2023 Elsevier Inc.         Signed,   Meredith Staggers, MD [facility], [facility] 03/24/22 3:39 PM

## 2022-03-26 ENCOUNTER — Other Ambulatory Visit (HOSPITAL_COMMUNITY): Payer: Self-pay

## 2022-03-29 ENCOUNTER — Encounter: Payer: Self-pay | Admitting: Internal Medicine

## 2022-03-29 ENCOUNTER — Ambulatory Visit (HOSPITAL_COMMUNITY)
Admission: RE | Admit: 2022-03-29 | Discharge: 2022-03-29 | Disposition: A | Discharge status: Home / Self Care | Payer: 59 | Source: Ambulatory Visit | Attending: Family Medicine | Admitting: Family Medicine

## 2022-03-29 ENCOUNTER — Ambulatory Visit: Payer: 59 | Admitting: Family Medicine

## 2022-03-29 DIAGNOSIS — F419 Anxiety disorder, unspecified: Secondary | ICD-10-CM | POA: Insufficient documentation

## 2022-03-29 DIAGNOSIS — H5713 Ocular pain, bilateral: Secondary | ICD-10-CM | POA: Diagnosis not present

## 2022-03-29 DIAGNOSIS — R519 Headache, unspecified: Secondary | ICD-10-CM | POA: Insufficient documentation

## 2022-04-02 ENCOUNTER — Ambulatory Visit (HOSPITAL_COMMUNITY): Payer: 59

## 2022-04-13 ENCOUNTER — Other Ambulatory Visit (HOSPITAL_COMMUNITY): Payer: Self-pay

## 2022-04-15 ENCOUNTER — Other Ambulatory Visit (HOSPITAL_COMMUNITY): Payer: Self-pay

## 2022-04-19 ENCOUNTER — Ambulatory Visit: Payer: 59 | Admitting: Family Medicine

## 2022-04-22 ENCOUNTER — Other Ambulatory Visit (HOSPITAL_COMMUNITY): Payer: Self-pay

## 2022-05-03 ENCOUNTER — Ambulatory Visit: Payer: 59 | Admitting: Family Medicine

## 2022-05-05 DIAGNOSIS — H52223 Regular astigmatism, bilateral: Secondary | ICD-10-CM | POA: Diagnosis not present

## 2022-05-05 DIAGNOSIS — H5213 Myopia, bilateral: Secondary | ICD-10-CM | POA: Diagnosis not present

## 2022-05-11 ENCOUNTER — Ambulatory Visit: Payer: 59 | Admitting: Internal Medicine

## 2022-06-07 ENCOUNTER — Other Ambulatory Visit (HOSPITAL_COMMUNITY): Payer: Self-pay

## 2022-06-18 ENCOUNTER — Ambulatory Visit: Payer: 59 | Admitting: Family Medicine

## 2022-06-21 ENCOUNTER — Ambulatory Visit: Payer: 59 | Admitting: Family Medicine

## 2022-06-21 ENCOUNTER — Encounter: Payer: Self-pay | Admitting: Family Medicine

## 2022-06-21 ENCOUNTER — Other Ambulatory Visit (HOSPITAL_COMMUNITY): Payer: Self-pay

## 2022-06-21 VITALS — BP 122/80 | HR 77 | Temp 98.3°F | Ht 67.0 in | Wt 212.2 lb

## 2022-06-21 DIAGNOSIS — K219 Gastro-esophageal reflux disease without esophagitis: Secondary | ICD-10-CM | POA: Diagnosis not present

## 2022-06-21 DIAGNOSIS — F5104 Psychophysiologic insomnia: Secondary | ICD-10-CM

## 2022-06-21 DIAGNOSIS — J309 Allergic rhinitis, unspecified: Secondary | ICD-10-CM

## 2022-06-21 DIAGNOSIS — F419 Anxiety disorder, unspecified: Secondary | ICD-10-CM | POA: Diagnosis not present

## 2022-06-21 DIAGNOSIS — R519 Headache, unspecified: Secondary | ICD-10-CM | POA: Diagnosis not present

## 2022-06-21 MED ORDER — SERTRALINE HCL 50 MG PO TABS
50.0000 mg | ORAL_TABLET | Freq: Every day | ORAL | 1 refills | Status: DC
  Filled 2022-06-21: qty 90, 90d supply, fill #0
  Filled 2022-09-21: qty 90, 90d supply, fill #1

## 2022-06-21 NOTE — Patient Instructions (Signed)
Glad to hear you are doing better.  No change in sertraline dose for now.  Melatonin is still an option if needed for sleep.  Glad to hear the headaches have improved.  Imitrex if needed for breakthrough headache but if you have to use it more frequently please be seen or follow-up with headache specialist.  Follow-up in 5 to 6 months for physical and we can recheck labs including borderline cholesterol level at that time.  Please let me know if there are questions sooner.  Take care.

## 2022-06-21 NOTE — Progress Notes (Signed)
Subjective:  Patient ID: Alicia Beltran, female    DOB: 07-Apr-1986  Age: 36 y.o. MRN: [id number]  CC:  Chief Complaint  Patient presents with   Follow-up    Follow up for medications - pt states all is well    HPI [name] presents for   Chronic headache: Discussed in July, longstanding headaches at that time.  Specialist.  Possible migraine versus tension versus occipital or cervicogenic headache.  Imitrex prescribed if needed temporarily, CT head ordered given change in increased frequency of headaches.  Also recommended follow-up with her we also discussed increasing Flonase to daily use in case component of allergy as well as saline nasal spray.  Increase fluid intake recommended and handout given on headaches. Head CT on 03/29/2022, negative. Did not see HA specialist. Saw optometry and HA 's improved.  No recent use. Has a few Imitrex left if needed.  Otc allergy pill QOD working ok for allergies.   Hyperlipidemia elevated LDL.  No current meds.  The ASCVD Risk score (Arnett DK, et al., 2019) failed to calculate for the following reasons:   The 2019 ASCVD risk score is only valid for ages 73 to 66 Some back issues keeping her form exercising. Followed by Dr. Rolena Infante prior. Back surgery 2 yrs ago.   Soda/sweet tea - 0-1per day.  Some snacking/sweets.  Trying to do better with diet.  Body mass index is 33.24 kg/m. Lab Results  Component Value Date   CHOL 179 11/30/2021   HDL 35.60 (L) 11/30/2021   LDLCALC 115 (H) 11/30/2021   TRIG 140.0 11/30/2021   CHOLHDL 5 11/30/2021   Lab Results  Component Value Date   ALT 18 11/30/2021   AST 15 11/30/2021   ALKPHOS 62 11/30/2021   BILITOT 0.5 11/30/2021      GERD  now followed by gastroenterology, Dr. Lorenso Courier.  Office visit in July noted.   Low FODMAP diet, continue omeprazole 40 mg twice daily, added Pepcid, started Linzess. Has stopped linzess - felt worse.  Feeling overall better.   Anxiety Zoloft  25 mg daily with melatonin as option for insomnia.  Anxiety was improving on Zoloft at her April visit. Still working well. No need for melatonin.   History There are no problems to display for this patient.  Past Medical History:  Diagnosis Date   Allergies    Anal fissure    Anxiety    Depression    GERD (gastroesophageal reflux disease)    Headache    Past Surgical History:  Procedure Laterality Date   HEMORROIDECTOMY  2010   LAPAROSCOPY ABDOMEN DIAGNOSTIC     LUMBAR LAMINECTOMY/DECOMPRESSION MICRODISCECTOMY Right 09/05/2019   Procedure: Right Lumbar five through Sacral one disectomy SAME DAY DISCHARGE;  Surgeon: Melina Schools, MD;  Location: [facility];  Service: Orthopedics;  Laterality: Right;  2.5 hrs   No Known Allergies Prior to Admission medications   Medication Sig Start Date End Date Taking? Authorizing Provider  famotidine (PEPCID) 20 MG tablet Take 1 tablet (20 mg total) by mouth at bedtime. 03/17/22  Yes Sharyn Creamer, MD  omeprazole (PRILOSEC) 40 MG capsule Take 1 capsule by mouth 2 times daily before a meal. 03/17/22  Yes Sharyn Creamer, MD  polyethylene glycol (MIRALAX / GLYCOLAX) 17 g packet Take 17 g by mouth daily.   Yes [provider]  sertraline (ZOLOFT) 50 MG tablet Take 1 tablet by mouth daily. 12/28/21  Yes Wendie Agreste, MD  SUMAtriptan [facility]) 25  MG tablet Take 1 tablet (25 mg total) by mouth as needed for migraine. May repeat in 2 hours if headache persists or recurs. Do not exceed 2 doses in 1 day. 03/24/22  Yes Shade Flood, MD  fluticasone (FLONASE) 50 MCG/ACT nasal spray Place 2 sprays into both nostrils daily. Patient not taking: Reported on 06/21/2022 09/19/21   Claiborne Rigg, NP  linaclotide [facility]) 72 MCG capsule Take 1 capsule (72 mcg total) by mouth daily before breakfast. Patient not taking: Reported on 06/21/2022 03/17/22   Imogene Burn, MD   Social History   Socioeconomic History   Marital status: Married    Spouse  name: Not on file   Number of children: 0   Years of education: Not on file   Highest education level: Not on file  Occupational History   Occupation: CNA  Tobacco Use   Smoking status: Never   Smokeless tobacco: Never  Vaping Use   Vaping Use: Never used  Substance and Sexual Activity   Alcohol use: Not Currently   Drug use: No   Sexual activity: Yes    Partners: Male    Birth control/protection: None  Other Topics Concern   Not on file  Social History Narrative   Not on file   Social Determinants of Health   Financial Resource Strain: Not on file  Food Insecurity: Not on file  Transportation Needs: Not on file  Physical Activity: Not on file  Stress: Not on file  Social Connections: Not on file  Intimate Partner Violence: Not on file    Review of Systems Per hPI.   Objective:   Vitals:   06/21/22 1312  BP: 122/80  Pulse: 77  Temp: 98.3 F (36.8 C)  SpO2: 100%  Weight: 212 lb 3.2 oz (96.3 kg)  Height: 5\' 7"  (1.702 m)     Physical Exam Vitals reviewed.  Constitutional:      Appearance: Normal appearance. She is well-developed.  HENT:     Head: Normocephalic and atraumatic.  Eyes:     Conjunctiva/sclera: Conjunctivae normal.     Pupils: Pupils are equal, round, and reactive to light.  Neck:     Vascular: No carotid bruit.  Cardiovascular:     Rate and Rhythm: Normal rate and regular rhythm.     Heart sounds: Normal heart sounds.  Pulmonary:     Effort: Pulmonary effort is normal.     Breath sounds: Normal breath sounds.  Abdominal:     Palpations: Abdomen is soft. There is no pulsatile mass.     Tenderness: There is no abdominal tenderness.  Musculoskeletal:     Right lower leg: No edema.     Left lower leg: No edema.  Skin:    General: Skin is warm and dry.  Neurological:     Mental Status: She is alert and oriented to person, place, and time.  Psychiatric:        Mood and Affect: Mood normal.        Behavior: Behavior normal.      Assessment & Plan:  Alicia Beltran is a 36 y.o. female . Gastroesophageal reflux disease, unspecified whether esophagitis present - stable on PPI, H2 -- continue same.   Anxiety - Plan: sertraline (ZOLOFT) 50 MG tablet Psychophysiological insomnia - Plan: sertraline (ZOLOFT) 50 MG tablet  -Stable on current dose sertraline, continue same.  Option of melatonin at bedtime if needed.  Sleeping well currently.  Nonintractable episodic headache, unspecified headache type  -  Improved after new corrective lenses.  Has Imitrex if needed for breakthrough headache and option of return office visit if frequent need or meeting with headache specialist.  Continue on every other day antihistamine if needed for allergies with option of adding Flonase.  RTC precautions.  Meds ordered this encounter  Medications   sertraline (ZOLOFT) 50 MG tablet    Sig: Take 1 tablet by mouth daily.    Dispense:  90 tablet    Refill:  1   Patient Instructions  Glad to hear you are doing better.  No change in sertraline dose for now.  Melatonin is still an option if needed for sleep.  Glad to hear the headaches have improved.  Imitrex if needed for breakthrough headache but if you have to use it more frequently please be seen or follow-up with headache specialist.  Follow-up in 5 to 6 months for physical and we can recheck labs including borderline cholesterol level at that time.  Please let me know if there are questions sooner.  Take care.     Signed,   Meredith Staggers, MD [facility], [facility] 06/21/22 2:17 PM

## 2022-06-22 ENCOUNTER — Other Ambulatory Visit (HOSPITAL_COMMUNITY): Payer: Self-pay

## 2022-07-28 ENCOUNTER — Other Ambulatory Visit (HOSPITAL_COMMUNITY): Payer: Self-pay

## 2022-08-23 ENCOUNTER — Telehealth: Payer: 59 | Admitting: Physician Assistant

## 2022-08-23 ENCOUNTER — Other Ambulatory Visit: Payer: Self-pay | Admitting: Internal Medicine

## 2022-08-23 ENCOUNTER — Other Ambulatory Visit (HOSPITAL_COMMUNITY): Payer: Self-pay

## 2022-08-23 DIAGNOSIS — R1310 Dysphagia, unspecified: Secondary | ICD-10-CM

## 2022-08-23 DIAGNOSIS — K2970 Gastritis, unspecified, without bleeding: Secondary | ICD-10-CM

## 2022-08-23 DIAGNOSIS — J208 Acute bronchitis due to other specified organisms: Secondary | ICD-10-CM | POA: Diagnosis not present

## 2022-08-23 MED ORDER — FAMOTIDINE 20 MG PO TABS
20.0000 mg | ORAL_TABLET | Freq: Every day | ORAL | 3 refills | Status: AC
  Filled 2022-08-23: qty 30, 30d supply, fill #0
  Filled 2022-09-21: qty 30, 30d supply, fill #1
  Filled 2022-10-27: qty 30, 30d supply, fill #2
  Filled 2022-11-29: qty 30, 30d supply, fill #3

## 2022-08-23 MED ORDER — OMEPRAZOLE 40 MG PO CPDR
40.0000 mg | DELAYED_RELEASE_CAPSULE | Freq: Two times a day (BID) | ORAL | 3 refills | Status: DC
  Filled 2022-08-23: qty 60, 30d supply, fill #0
  Filled 2022-09-21: qty 60, 30d supply, fill #1
  Filled 2022-10-27: qty 60, 30d supply, fill #2
  Filled 2022-11-29: qty 60, 30d supply, fill #3

## 2022-08-24 ENCOUNTER — Other Ambulatory Visit (HOSPITAL_COMMUNITY): Payer: Self-pay

## 2022-08-24 MED ORDER — PREDNISONE 20 MG PO TABS
40.0000 mg | ORAL_TABLET | Freq: Every day | ORAL | 0 refills | Status: DC
  Filled 2022-08-24: qty 10, 5d supply, fill #0

## 2022-08-24 MED ORDER — ALBUTEROL SULFATE HFA 108 (90 BASE) MCG/ACT IN AERS
2.0000 | INHALATION_SPRAY | Freq: Four times a day (QID) | RESPIRATORY_TRACT | 0 refills | Status: DC | PRN
  Filled 2022-08-24: qty 6.7, 25d supply, fill #0

## 2022-08-24 MED ORDER — BENZONATATE 100 MG PO CAPS
100.0000 mg | ORAL_CAPSULE | Freq: Three times a day (TID) | ORAL | 0 refills | Status: DC | PRN
  Filled 2022-08-24: qty 30, 10d supply, fill #0

## 2022-08-24 NOTE — Progress Notes (Signed)
We are sorry that you are not feeling well.  Here is how we plan to help!  Based on your presentation I believe you most likely have A cough due to a virus.  This is called viral bronchitis and is best treated by rest, plenty of fluids and control of the cough.  You may use Ibuprofen or Tylenol as directed to help your symptoms.     In addition you may use A prescription cough medication called Tessalon Perles 100mg . You may take 1-2 capsules every 8 hours as needed for your cough.  I have also prescribed an albuterol inhaler and a short course of prednisone to open airways and alleviate wheezing and the bronchospasm that is also likely contributing to cough.   From your responses in the eVisit questionnaire you describe inflammation in the upper respiratory tract which is causing a significant cough.  This is commonly called Bronchitis and has four common causes:   Allergies Viral Infections Acid Reflux Bacterial Infection Allergies, viruses and acid reflux are treated by controlling symptoms or eliminating the cause. An example might be a cough caused by taking certain blood pressure medications. You stop the cough by changing the medication. Another example might be a cough caused by acid reflux. Controlling the reflux helps control the cough.  USE OF BRONCHODILATOR ("RESCUE") INHALERS: There is a risk from using your bronchodilator too frequently.  The risk is that over-reliance on a medication which only relaxes the muscles surrounding the breathing tubes can reduce the effectiveness of medications prescribed to reduce swelling and congestion of the tubes themselves.  Although you feel brief relief from the bronchodilator inhaler, your asthma may actually be worsening with the tubes becoming more swollen and filled with mucus.  This can delay other crucial treatments, such as oral steroid medications. If you need to use a bronchodilator inhaler daily, several times per day, you should discuss  this with your provider.  There are probably better treatments that could be used to keep your asthma under control.     HOME CARE Only take medications as instructed by your medical team. Complete the entire course of an antibiotic. Drink plenty of fluids and get plenty of rest. Avoid close contacts especially the very young and the elderly Cover your mouth if you cough or cough into your sleeve. Always remember to wash your hands A steam or ultrasonic humidifier can help congestion.   GET HELP RIGHT AWAY IF: You develop worsening fever. You become short of breath You cough up blood. Your symptoms persist after you have completed your treatment plan MAKE SURE YOU  Understand these instructions. Will watch your condition. Will get help right away if you are not doing well or get worse.    Thank you for choosing an e-visit.  Your e-visit answers were reviewed by a board certified advanced clinical practitioner to complete your personal care plan. Depending upon the condition, your plan could have included both over the counter or prescription medications.  Please review your pharmacy choice. Make sure the pharmacy is open so you can pick up prescription now. If there is a problem, you may contact your provider through and have the prescription routed to another pharmacy.  Your safety is important to [employer]. If you have drug allergies check your prescription carefully.   For the next 24 hours you can use MyChart to ask questions about today's visit, request a non-urgent call back, or ask for a work or school excuse. You will  get an email in the next two days asking about your experience. I hope that your e-visit has been valuable and will speed your recovery.

## 2022-08-24 NOTE — Progress Notes (Signed)
I have spent 5 minutes in review of e-visit questionnaire, review and updating patient chart, medical decision making and response to patient.   Yashar Inclan Cody Gardiner [place], PA-C    

## 2022-11-16 ENCOUNTER — Encounter: Payer: Self-pay | Admitting: Family Medicine

## 2022-11-24 ENCOUNTER — Encounter: Payer: Self-pay | Admitting: Family Medicine

## 2022-11-24 ENCOUNTER — Other Ambulatory Visit (HOSPITAL_COMMUNITY): Payer: Self-pay

## 2022-11-24 ENCOUNTER — Ambulatory Visit (INDEPENDENT_AMBULATORY_CARE_PROVIDER_SITE_OTHER): Payer: Commercial Managed Care - PPO | Admitting: Family Medicine

## 2022-11-24 VITALS — BP 128/74 | HR 80 | Temp 98.7°F | Ht 65.0 in | Wt 217.4 lb

## 2022-11-24 DIAGNOSIS — Z131 Encounter for screening for diabetes mellitus: Secondary | ICD-10-CM

## 2022-11-24 DIAGNOSIS — Z130 Encounter for screening for diseases of the blood and blood-forming organs and certain disorders involving the immune mechanism: Secondary | ICD-10-CM

## 2022-11-24 DIAGNOSIS — F5104 Psychophysiologic insomnia: Secondary | ICD-10-CM | POA: Diagnosis not present

## 2022-11-24 DIAGNOSIS — Z13220 Encounter for screening for lipoid disorders: Secondary | ICD-10-CM

## 2022-11-24 DIAGNOSIS — Z0000 Encounter for general adult medical examination without abnormal findings: Secondary | ICD-10-CM

## 2022-11-24 DIAGNOSIS — F419 Anxiety disorder, unspecified: Secondary | ICD-10-CM

## 2022-11-24 MED ORDER — SERTRALINE HCL 100 MG PO TABS
100.0000 mg | ORAL_TABLET | Freq: Every day | ORAL | 1 refills | Status: DC
  Filled 2022-11-24: qty 90, 90d supply, fill #0

## 2022-11-24 NOTE — Progress Notes (Unsigned)
Subjective:  Patient ID: Alicia Beltran, female    DOB: [birth year]  Age: 37 y.o. MRN: [id number]  CC:  Chief Complaint  Patient presents with   Annual Exam    Pt notes fasting no concerns     HPI Alicia Beltran presents for Annual Exam  Chronic headache discussed last October.  Prior CT head was negative in July of last year.  Saw optometry with improvement in headaches.  Imitrex if needed.  Allergy pill every other day was helping for allergies at that time. Rare need for imitrex. Allergy meds intermittently working well.  Flonase caused headache.    Hyperlipidemia: No Current meds. With obesity.  Wt Readings from Last 3 Encounters:  11/24/22 217 lb 6.4 oz (98.6 kg)  06/21/22 212 lb 3.2 oz (96.3 kg)  03/24/22 204 lb 3.2 oz (92.6 kg)  Body mass index is 36.18 kg/m.  The ASCVD Risk score (Arnett DK, et al., 2019) failed to calculate for the following reasons:   The 2019 ASCVD risk score is only valid for ages 35 to 24 Exercise - only activity at work.  Less motivated after work, some dietary indiscretion, stress eating at times.   Lab Results  Component Value Date   CHOL 179 11/30/2021   HDL 35.60 (L) 11/30/2021   LDLCALC 115 (H) 11/30/2021   TRIG 140.0 11/30/2021   CHOLHDL 5 11/30/2021   Lab Results  Component Value Date   ALT 18 11/30/2021   AST 15 11/30/2021   ALKPHOS 62 11/30/2021   BILITOT 0.5 11/30/2021   GERD Followed by gastroenterology, Dr. Lorenso Courier continued on omeprazole, Pepcid, Linzess and low FODMAP diet, felt worsening Linzess, discontinued.  Improvement at her October visit. Pepcid, prilosec working well. Has not seen GI recently. Rare cough, possible PND. Occasional tonsil stones.   Anxiety Sertraline 50 mg daily with melatonin as option for insomnia.  Stable at her October visit. Stress with husbands health in January - he is doing ok for now.  More stressed, irritable. Easily annoyed.      11/24/2022    3:08 PM  GAD 7 :  Generalized Anxiety Score  Nervous, Anxious, on Edge 0  Control/stop worrying 0  Worry too much - different things 1  Trouble relaxing 0  Restless 0  Easily annoyed or irritable 1  Afraid - awful might happen 0  Total GAD 7 Score 2         11/24/2022    3:08 PM 06/21/2022    1:09 PM 03/24/2022    2:45 PM 11/30/2021    9:20 AM  Depression screen PHQ 2/9  Decreased Interest 0 0 0 0  Down, Depressed, Hopeless 0 0 0 0  PHQ - 2 Score 0 0 0 0  Altered sleeping 1 0    Tired, decreased energy 1 0    Change in appetite 0 1    Feeling bad or failure about yourself  0 0    Trouble concentrating 0 0    Moving slowly or fidgety/restless 0 0    Suicidal thoughts 0 0    PHQ-9 Score 2 1      Health Maintenance  Topic Date Due   Hepatitis C Screening  12/01/2022 (Originally 05/08/2002)   HIV Screening  12/01/2022 (Originally 05/09/1999)   INFLUENZA VACCINE  12/05/2022 (Originally 04/06/2022)   COVID-19 Vaccine (3 - 2023-24 season) 12/10/2022 (Originally 05/07/2022)   PAP SMEAR-Modifier  06/12/2023   DTaP/Tdap/Td (3 - Td or Tdap) 10/06/2031   HPV  VACCINES  Aged Out  GYN - Dr. Ulanda Edison pap due in October.   Immunization History  Administered Date(s) Administered   [employer] Vaccination 04/11/2020, 05/09/2020   Tdap 08/30/2011, 10/05/2021  Flu vaccine at work. Covid booster - declines.   No results found. Optho - appt last year. Wears glasses. Appt in August.   Dental:Within Last 6 months  Alcohol: none  Tobacco: none.   Exercise:as above - minimal  Back surgery few years ago, Dr. Rolena Infante.    History There are no problems to display for this patient.  Past Medical History:  Diagnosis Date   Allergies    Anal fissure    Anxiety    Depression    GERD (gastroesophageal reflux disease)    Headache    Past Surgical History:  Procedure Laterality Date   HEMORROIDECTOMY  2010   LAPAROSCOPY ABDOMEN DIAGNOSTIC     LUMBAR LAMINECTOMY/DECOMPRESSION MICRODISCECTOMY Right  09/05/2019   Procedure: Right Lumbar five through Sacral one disectomy SAME DAY DISCHARGE;  Surgeon: Melina Schools, MD;  Location: [facility];  Service: Orthopedics;  Laterality: Right;  2.5 hrs   No Known Allergies Prior to Admission medications   Medication Sig Start Date End Date Taking? Authorizing Provider  famotidine (PEPCID) 20 MG tablet Take 1 tablet (20 mg total) by mouth at bedtime. 08/23/22  Yes Sharyn Creamer, MD  omeprazole (PRILOSEC) 40 MG capsule Take 1 capsule by mouth 2 times daily before a meal. 08/23/22  Yes Sharyn Creamer, MD  polyethylene glycol (MIRALAX / GLYCOLAX) 17 g packet Take 17 g by mouth daily.   Yes [provider]  sertraline (ZOLOFT) 50 MG tablet Take 1 tablet (50 mg total) by mouth daily. 06/21/22  Yes Wendie Agreste, MD  SUMAtriptan (IMITREX) 25 MG tablet Take 1 tablet (25 mg total) by mouth as needed for migraine. May repeat in 2 hours if headache persists or recurs. Do not exceed 2 doses in 1 day. 03/24/22  Yes Wendie Agreste, MD   Social History   Socioeconomic History   Marital status: Married    Spouse name: Not on file   Number of children: 0   Years of education: Not on file   Highest education level: Not on file  Occupational History   Occupation: CNA  Tobacco Use   Smoking status: Never   Smokeless tobacco: Never  Vaping Use   Vaping Use: Never used  Substance and Sexual Activity   Alcohol use: Not Currently   Drug use: No   Sexual activity: Yes    Partners: Male    Birth control/protection: None  Other Topics Concern   Not on file  Social History Narrative   Not on file   Social Determinants of Health   Financial Resource Strain: Not on file  Food Insecurity: Not on file  Transportation Needs: Not on file  Physical Activity: Not on file  Stress: Not on file  Social Connections: Not on file  Intimate Partner Violence: Not on file    Review of Systems 13 point review of systems per patient health survey noted.   Negative other than as indicated above or in HPI.    Objective:   Vitals:   11/24/22 1508  BP: 128/74  Pulse: 80  Temp: 98.7 F (37.1 C)  TempSrc: Temporal  SpO2: 97%  Weight: 217 lb 6.4 oz (98.6 kg)  Height: 5\' 5"  (1.651 m)   {Vitals History (Optional):23777}  Physical Exam Constitutional:  Appearance: She is well-developed.  HENT:     Head: Normocephalic and atraumatic.     Right Ear: External ear normal.     Left Ear: External ear normal.  Eyes:     Conjunctiva/sclera: Conjunctivae normal.     Pupils: Pupils are equal, round, and reactive to light.  Neck:     Thyroid: No thyromegaly.  Cardiovascular:     Rate and Rhythm: Normal rate and regular rhythm.     Heart sounds: Normal heart sounds. No murmur heard. Pulmonary:     Effort: Pulmonary effort is normal. No respiratory distress.     Breath sounds: Normal breath sounds. No wheezing.  Abdominal:     General: Bowel sounds are normal.     Palpations: Abdomen is soft.     Tenderness: There is no abdominal tenderness.  Musculoskeletal:        General: No tenderness. Normal range of motion.     Cervical back: Normal range of motion and neck supple.  Lymphadenopathy:     Cervical: No cervical adenopathy.  Skin:    General: Skin is warm and dry.     Findings: No rash.  Neurological:     Mental Status: She is alert and oriented to person, place, and time.  Psychiatric:        Behavior: Behavior normal.        Thought Content: Thought content normal.        Assessment & Plan:  Alicia Beltran is a 37 y.o. female . Annual physical exam  Screening for diabetes mellitus  Screening for hyperlipidemia  Screening for deficiency anemia  Anxiety  Psychophysiological insomnia   No orders of the defined types were placed in this encounter.  There are no Patient Instructions on file for this visit.    Signed,   Merri Ray, MD [place], [facility] 11/24/22 3:41 PM

## 2022-11-24 NOTE — Patient Instructions (Addendum)
Try higher dose sertraline. Try walking or low intensity exercise few days per week. Even 10-15 mins can be helpful. EAP program at work an option for counseling.  Nasacort or nasonex for allergies.  Recheck in 1 month. If any concerns on labs I will let you know.  Take care!  Preventive Care 6-39 Years Old, Female Preventive care refers to lifestyle choices and visits with your health care provider that can promote health and wellness. Preventive care visits are also called wellness exams. What can I expect for my preventive care visit? Counseling During your preventive care visit, your health care provider may ask about your: Medical history, including: Past medical problems. Family medical history. Pregnancy history. Current health, including: Menstrual cycle. Method of birth control. Emotional well-being. Home life and relationship well-being. Sexual activity and sexual health. Lifestyle, including: Alcohol, nicotine or tobacco, and drug use. Access to firearms. Diet, exercise, and sleep habits. Work and work Statistician. Sunscreen use. Safety issues such as seatbelt and bike helmet use. Physical exam Your health care provider may check your: Height and weight. These may be used to calculate your BMI (body mass index). BMI is a measurement that tells if you are at a healthy weight. Waist circumference. This measures the distance around your waistline. This measurement also tells if you are at a healthy weight and may help predict your risk of certain diseases, such as type 2 diabetes and high blood pressure. Heart rate and blood pressure. Body temperature. Skin for abnormal spots. What immunizations do I need?  Vaccines are usually given at various ages, according to a schedule. Your health care provider will recommend vaccines for you based on your age, medical history, and lifestyle or other factors, such as travel or where you work. What tests do I need? Screening Your  health care provider may recommend screening tests for certain conditions. This may include: Pelvic exam and Pap test. Lipid and cholesterol levels. Diabetes screening. This is done by checking your blood sugar (glucose) after you have not eaten for a while (fasting). Hepatitis B test. Hepatitis C test. HIV (human immunodeficiency virus) test. STI (sexually transmitted infection) testing, if you are at risk. BRCA-related cancer screening. This may be done if you have a family history of breast, ovarian, tubal, or peritoneal cancers. Talk with your health care provider about your test results, treatment options, and if necessary, the need for more tests. Follow these instructions at home: Eating and drinking  Eat a healthy diet that includes fresh fruits and vegetables, whole grains, lean protein, and low-fat dairy products. Take vitamin and mineral supplements as recommended by your health care provider. Do not drink alcohol if: Your health care provider tells you not to drink. You are pregnant, may be pregnant, or are planning to become pregnant. If you drink alcohol: Limit how much you have to 0-1 drink a day. Know how much alcohol is in your drink. In the [place] one drink equals one 12 oz bottle of beer (355 mL), one 5 oz glass of wine (148 mL), or one 1 oz glass of hard liquor (44 mL). Lifestyle Brush your teeth every morning and night with fluoride toothpaste. Floss one time each day. Exercise for at least 30 minutes 5 or more days each week. Do not use any products that contain nicotine or tobacco. These products include cigarettes, chewing tobacco, and vaping devices, such as e-cigarettes. If you need help quitting, ask your health care provider. Do not use drugs. If you are sexually  active, practice safe sex. Use a condom or other form of protection to prevent STIs. If you do not wish to become pregnant, use a form of birth control. If you plan to become pregnant, see your health  care provider for a prepregnancy visit. Find healthy ways to manage stress, such as: Meditation, yoga, or listening to music. Journaling. Talking to a trusted person. Spending time with friends and family. Minimize exposure to UV radiation to reduce your risk of skin cancer. Safety Always wear your seat belt while driving or riding in a vehicle. Do not drive: If you have been drinking alcohol. Do not ride with someone who has been drinking. If you have been using any mind-altering substances or drugs. While texting. When you are tired or distracted. Wear a helmet and other protective equipment during sports activities. If you have firearms in your house, make sure you follow all gun safety procedures. Seek help if you have been physically or sexually abused. What's next? Go to your health care provider once a year for an annual wellness visit. Ask your health care provider how often you should have your eyes and teeth checked. Stay up to date on all vaccines. This information is not intended to replace advice given to you by your health care provider. Make sure you discuss any questions you have with your health care provider. Document Revised: 02/18/2021 Document Reviewed: 02/18/2021 Elsevier Patient Education  [place].

## 2022-11-25 ENCOUNTER — Other Ambulatory Visit (HOSPITAL_COMMUNITY): Payer: Self-pay

## 2022-11-25 LAB — HEMOGLOBIN A1C: Hgb A1c MFr Bld: 5.9 % (ref 4.6–6.5)

## 2022-11-25 LAB — COMPREHENSIVE METABOLIC PANEL
ALT: 20 U/L (ref 0–35)
AST: 18 U/L (ref 0–37)
Albumin: 4.5 g/dL (ref 3.5–5.2)
Alkaline Phosphatase: 63 U/L (ref 39–117)
BUN: 18 mg/dL (ref 6–23)
CO2: 23 mEq/L (ref 19–32)
Calcium: 9.4 mg/dL (ref 8.4–10.5)
Chloride: 105 mEq/L (ref 96–112)
Creatinine, Ser: 0.88 mg/dL (ref 0.40–1.20)
GFR: 83.29 mL/min (ref >60.00)
Glucose, Bld: 91 mg/dL (ref 70–99)
Potassium: 3.6 mEq/L (ref 3.5–5.1)
Sodium: 138 mEq/L (ref 135–145)
Total Bilirubin: 0.4 mg/dL (ref 0.2–1.2)
Total Protein: 7.9 g/dL (ref 6.0–8.3)

## 2022-11-25 LAB — CBC WITH DIFFERENTIAL/PLATELET
Basophils Absolute: 0.1 10*3/uL (ref 0.0–0.1)
Basophils Relative: 0.9 % (ref 0.0–3.0)
Eosinophils Absolute: 0.1 10*3/uL (ref 0.0–0.7)
Eosinophils Relative: 0.6 % (ref 0.0–5.0)
HCT: 38.2 % (ref 36.0–46.0)
Hemoglobin: 12.5 g/dL (ref 12.0–15.0)
Lymphocytes Relative: 23.2 % (ref 12.0–46.0)
Lymphs Abs: 2.2 10*3/uL (ref 0.7–4.0)
MCHC: 32.8 g/dL (ref 30.0–36.0)
MCV: 83.7 fl (ref 78.0–100.0)
Monocytes Absolute: 0.7 10*3/uL (ref 0.1–1.0)
Monocytes Relative: 7.3 % (ref 3.0–12.0)
Neutro Abs: 6.5 10*3/uL (ref 1.4–7.7)
Neutrophils Relative %: 68 % (ref 43.0–77.0)
Platelets: 221 10*3/uL (ref 150.0–400.0)
RBC: 4.57 Mil/uL (ref 3.87–5.11)
RDW: 13.9 % (ref 11.5–15.5)
WBC: 9.5 10*3/uL (ref 4.0–10.5)

## 2022-11-25 LAB — LIPID PANEL
Cholesterol: 125 mg/dL (ref 0–200)
HDL: 24.7 mg/dL — ABNORMAL LOW (ref >39.00)
LDL Cholesterol: 78 mg/dL (ref 0–99)
NonHDL: 100.7
Total CHOL/HDL Ratio: 5
Triglycerides: 113 mg/dL (ref 0.0–149.0)
VLDL: 22.6 mg/dL (ref 0.0–40.0)

## 2022-11-30 ENCOUNTER — Other Ambulatory Visit (HOSPITAL_COMMUNITY): Payer: Self-pay

## 2022-11-30 DIAGNOSIS — M5451 Vertebrogenic low back pain: Secondary | ICD-10-CM | POA: Diagnosis not present

## 2022-11-30 MED ORDER — METHYLPREDNISOLONE 4 MG PO TBPK
ORAL_TABLET | ORAL | 0 refills | Status: DC
  Filled 2022-11-30: qty 21, 6d supply, fill #0

## 2022-12-01 ENCOUNTER — Other Ambulatory Visit (HOSPITAL_COMMUNITY): Payer: Self-pay

## 2022-12-02 ENCOUNTER — Other Ambulatory Visit (HOSPITAL_COMMUNITY): Payer: Self-pay

## 2022-12-02 DIAGNOSIS — M5451 Vertebrogenic low back pain: Secondary | ICD-10-CM | POA: Diagnosis not present

## 2022-12-15 DIAGNOSIS — M5416 Radiculopathy, lumbar region: Secondary | ICD-10-CM | POA: Diagnosis not present

## 2022-12-20 DIAGNOSIS — M5416 Radiculopathy, lumbar region: Secondary | ICD-10-CM | POA: Diagnosis not present

## 2022-12-27 ENCOUNTER — Ambulatory Visit: Payer: Commercial Managed Care - PPO | Admitting: Family Medicine

## 2023-02-01 DIAGNOSIS — M5451 Vertebrogenic low back pain: Secondary | ICD-10-CM | POA: Diagnosis not present

## 2023-02-10 ENCOUNTER — Ambulatory Visit (INDEPENDENT_AMBULATORY_CARE_PROVIDER_SITE_OTHER): Payer: Commercial Managed Care - PPO | Admitting: Family Medicine

## 2023-02-10 ENCOUNTER — Other Ambulatory Visit (HOSPITAL_COMMUNITY): Payer: Self-pay

## 2023-02-10 VITALS — BP 120/74 | HR 88 | Temp 97.6°F | Ht 65.0 in | Wt 226.4 lb

## 2023-02-10 DIAGNOSIS — F5104 Psychophysiologic insomnia: Secondary | ICD-10-CM | POA: Diagnosis not present

## 2023-02-10 DIAGNOSIS — K2970 Gastritis, unspecified, without bleeding: Secondary | ICD-10-CM

## 2023-02-10 DIAGNOSIS — F419 Anxiety disorder, unspecified: Secondary | ICD-10-CM | POA: Diagnosis not present

## 2023-02-10 DIAGNOSIS — J029 Acute pharyngitis, unspecified: Secondary | ICD-10-CM

## 2023-02-10 DIAGNOSIS — J351 Hypertrophy of tonsils: Secondary | ICD-10-CM | POA: Diagnosis not present

## 2023-02-10 LAB — POCT RAPID STREP A (OFFICE): Rapid Strep A Screen: NEGATIVE

## 2023-02-10 MED ORDER — OMEPRAZOLE 40 MG PO CPDR
40.0000 mg | DELAYED_RELEASE_CAPSULE | Freq: Two times a day (BID) | ORAL | 3 refills | Status: AC
  Filled 2023-02-10 – 2023-02-21 (×2): qty 60, 30d supply, fill #0
  Filled 2023-06-02: qty 60, 30d supply, fill #1

## 2023-02-10 MED ORDER — SERTRALINE HCL 100 MG PO TABS
100.0000 mg | ORAL_TABLET | Freq: Every day | ORAL | 1 refills | Status: AC
  Filled 2023-02-10 – 2023-02-22 (×2): qty 90, 90d supply, fill #0
  Filled 2023-06-02: qty 90, 90d supply, fill #1

## 2023-02-10 NOTE — Patient Instructions (Addendum)
No change in sertraline dose for now - glad that has worked better. If worsening, I would recommend contacting therapist as discussed but let me know as well.   Continue heartburn medication daily.  If any worsening abdominal pain or more frequent abdominal pain be seen.  Tonsilloliths can come from multiple reasons including allergies, viruses causing sore throat, but if you continue to have irritation of that tonsil or enlarged tonsil I would recommend ear nose and throat evaluation.  I will check some blood work today including some other viruses but I expect the current soreness to improve through the weekend.  Follow-up next week if that is not improving or any worsening sooner be seen at urgent care or ER.  I do not expect that to happen.  Let me know if there are questions.   Sore Throat A sore throat is pain, burning, irritation, or scratchiness in the throat. When you have a sore throat, you may feel pain or tenderness in your throat when you swallow or talk. Many things can cause a sore throat, including: An infection. Seasonal allergies. Dryness in the air. Irritants, such as smoke or pollution. Radiation treatment for cancer. Gastroesophageal reflux disease (GERD). A tumor. A sore throat is often the first sign of another sickness. It may happen with other symptoms, such as coughing, sneezing, fever, and swollen neck glands. Most sore throats go away without medical treatment. Follow these instructions at home:     Medicines Take over-the-counter and prescription medicines only as told by your health care provider. Children often get sore throats. Do not give your child aspirin because of the association with Reye's syndrome. Use throat sprays to soothe your throat as told by your health care provider. Managing pain To help with pain, try: Sipping warm liquids, such as broth, herbal tea, or warm water. Eating or drinking cold or frozen liquids, such as frozen ice  pops. Gargling with a mixture of salt and water 3-4 times a day or as needed. To make salt water, completely dissolve -1 tsp (3-6 g) of salt in 1 cup (237 mL) of warm water. Sucking on hard candy or throat lozenges. Putting a cool-mist humidifier in your bedroom at night to moisten the air. Sitting in the bathroom with the door closed for 5-10 minutes while you run hot water in the shower. General instructions Do not use any products that contain nicotine or tobacco. These products include cigarettes, chewing tobacco, and vaping devices, such as e-cigarettes. If you need help quitting, ask your health care provider. Rest as needed. Drink enough fluid to keep your urine pale yellow. Wash your hands often with soap and water for at least 20 seconds. If soap and water are not available, use hand sanitizer. Contact a health care provider if: You have a fever for more than 2-3 days. You have symptoms that last for more than 2-3 days. Your throat does not get better within 7 days. You have a fever and your symptoms suddenly get worse. Get help right away if: You have difficulty breathing. You cannot swallow fluids, soft foods, or your saliva. You have increased swelling in your throat or neck. You have persistent nausea and vomiting. These symptoms may represent a serious problem that is an emergency. Do not wait to see if the symptoms will go away. Get medical help right away. Call your local emergency services (911 in the [place] Do not drive yourself to the hospital. Summary A sore throat is pain, burning, irritation, or  scratchiness in the throat. Many things can cause a sore throat. Take over-the-counter medicines only as told by your health care provider. Rest as needed. Drink enough fluid to keep your urine pale yellow. Contact a health care provider if your throat does not get better within 7 days. This information is not intended to replace advice given to you by your health care  provider. Make sure you discuss any questions you have with your health care provider. Document Revised: 11/19/2020 Document Reviewed: 11/19/2020 Elsevier Patient Education  2024 [employer].

## 2023-02-10 NOTE — Progress Notes (Signed)
Subjective:  Patient ID: Alicia Beltran, female    DOB: [birth year]  Age: 37 y.o. MRN: [id number]  CC:  Chief Complaint  Patient presents with   Sore Throat    Notes swollen tonsil for about 1-2 months, thinks she has a tonsil stone on that side, causing pain in her ear on Lt side as well    Anxiety    Patient was supposed to follow up in April about her medications     HPI Alicia Beltran presents for  Sore throat, swollen tonsil Tonsil stones off and on left side every few weeks for past few months. Feels stuck this time -past 4 days. Feels swollen, pain to left ear. Hearing ok. Chronic nasal congestion treated with daily nasonex.  No sick contacts or strep exposure.  No fever. Hurts to swallow but able to swallow and eating/drinking ok.  No cough, rash or other infection symptoms.  Prior white spot in throat improved.  Tx: salt water gargles, ibuprofen, tylenol.   Heartburn controlled with prilosec - rare abd pain with overeating only - once per week or less.  No melena, hematochezia.    Anxiety with insomnia Decreased control at her March visit.  Sertraline was increased to 100 mg, stress/anxiety management discussed including meeting with therapist or EAP program, and walking for exercise. Has not met with therapist. Higher dose of sertraline working better. No new side effects and sleeping better   History There are no problems to display for this patient.  Past Medical History:  Diagnosis Date   Allergies    Anal fissure    Anxiety    Depression    GERD (gastroesophageal reflux disease)    Headache    Past Surgical History:  Procedure Laterality Date   HEMORROIDECTOMY  2010   LAPAROSCOPY ABDOMEN DIAGNOSTIC     LUMBAR LAMINECTOMY/DECOMPRESSION MICRODISCECTOMY Right 09/05/2019   Procedure: Right Lumbar five through Sacral one disectomy SAME DAY DISCHARGE;  Surgeon: Venita Lick, MD;  Location: [facility] OR;  Service: Orthopedics;  Laterality: Right;  2.5  hrs   No Known Allergies Prior to Admission medications   Medication Sig Start Date End Date Taking? Authorizing Provider  famotidine (PEPCID) 20 MG tablet Take 1 tablet (20 mg total) by mouth at bedtime. 08/23/22  Yes Imogene Burn, MD  methylPREDNISolone (MEDROL) 4 MG TBPK tablet Take as directed for 6 days, for back pain. 11/30/22  Yes   omeprazole (PRILOSEC) 40 MG capsule Take 1 capsule by mouth 2 times daily before a meal. 08/23/22  Yes Imogene Burn, MD  polyethylene glycol (MIRALAX / GLYCOLAX) 17 g packet Take 17 g by mouth daily.   Yes [provider]  sertraline (ZOLOFT) 100 MG tablet Take 1 tablet (100 mg total) by mouth daily. 11/24/22  Yes Shade Flood, MD  SUMAtriptan (IMITREX) 25 MG tablet Take 1 tablet (25 mg total) by mouth as needed for migraine. May repeat in 2 hours if headache persists or recurs. Do not exceed 2 doses in 1 day. 03/24/22  Yes Shade Flood, MD   Social History   Socioeconomic History   Marital status: Married    Spouse name: Not on file   Number of children: 0   Years of education: Not on file   Highest education level: Not on file  Occupational History   Occupation: CNA  Tobacco Use   Smoking status: Never   Smokeless tobacco: Never  Vaping Use   Vaping Use: Never used  Substance and Sexual Activity   Alcohol use: Not Currently   Drug use: No   Sexual activity: Yes    Partners: Male    Birth control/protection: None  Other Topics Concern   Not on file  Social History Narrative   Not on file   Social Determinants of Health   Financial Resource Strain: Not on file  Food Insecurity: Not on file  Transportation Needs: Not on file  Physical Activity: Not on file  Stress: Not on file  Social Connections: Not on file  Intimate Partner Violence: Not on file    Review of Systems   Objective:   Vitals:   02/10/23 1539  BP: 120/74  Pulse: 88  Temp: 97.6 F (36.4 C)  TempSrc: Temporal  SpO2: 98%  Weight: 226 lb  6.4 oz (102.7 kg)  Height: 5\' 5"  (1.651 m)     Physical Exam Vitals reviewed.  Constitutional:      General: She is not in acute distress.    Appearance: She is well-developed.  HENT:     Head: Normocephalic and atraumatic.     Right Ear: Hearing, tympanic membrane, ear canal and external ear normal.     Left Ear: Hearing, tympanic membrane, ear canal and external ear normal.     Nose: Nose normal.     Mouth/Throat:     Mouth: Mucous membranes are moist.     Pharynx: No oropharyngeal exudate or posterior oropharyngeal erythema.     Comments: Slightly prominent left tonsil compared to right with minimal cryptic appearance.  No visible tonsilloliths or exudate appreciated, no vesicles, uvula midline.  Clearing secretions.  No definitive facial swelling.  Mastoid nontender, pinna nontender on left, canal clear and TM pearly gray on left.  Jaw nontender.    Eyes:     Conjunctiva/sclera: Conjunctivae normal.     Pupils: Pupils are equal, round, and reactive to light.  Cardiovascular:     Rate and Rhythm: Normal rate and regular rhythm.     Heart sounds: Normal heart sounds. No murmur heard. Pulmonary:     Effort: Pulmonary effort is normal. No respiratory distress.     Breath sounds: Normal breath sounds. No wheezing or rhonchi.  Abdominal:     General: Abdomen is flat.     Tenderness: There is no abdominal tenderness.  Lymphadenopathy:     Cervical: No cervical adenopathy (no appreciable cervical lymphadenopathy.).  Skin:    General: Skin is warm and dry.     Findings: No rash.  Neurological:     Mental Status: She is alert and oriented to person, place, and time.  Psychiatric:        Mood and Affect: Mood normal.        Behavior: Behavior normal.        Assessment & Plan:  Alicia Beltran is a 37 y.o. female . Tonsillar enlargement - Plan: POCT rapid strep A, Epstein-Barr virus VCA antibody panel, CBC, CMV IgM Sore throat - Plan: Epstein-Barr virus VCA  antibody panel, CBC, CMV IgM  -Intermittent tonsilloliths, slight asymmetry of left versus right tonsillar area, recesses.  No apparent abscess, no uvular shift, no tonsilloliths seen on exam.  Possible reactive component with viral illness but will check CBC, CMV and EBV testing to rule out mono, CMV.  Rapid strep was negative.  Consider ENT eval if not improving with ER precautions if worse.  Anxiety - Plan: sertraline (ZOLOFT) 100 MG tablet  -Improved on higher dose sertraline, continue same  with option of counseling if not continuing to improve.  Psychophysiological insomnia - Plan: sertraline (ZOLOFT) 100 MG tablet  -As above improved, continue Zoloft same dose for now.  Gastritis, presence of bleeding unspecified, unspecified chronicity, unspecified gastritis type - Plan: omeprazole (PRILOSEC) 40 MG capsule  -Stable, continue Prilosec same dose, with RTC/ER precautions if more frequent abdominal symptoms or acute worsening.   Meds ordered this encounter  Medications   sertraline (ZOLOFT) 100 MG tablet    Sig: Take 1 tablet (100 mg total) by mouth daily.    Dispense:  90 tablet    Refill:  1   omeprazole (PRILOSEC) 40 MG capsule    Sig: Take 1 capsule by mouth 2 times daily before a meal.    Dispense:  60 capsule    Refill:  3   Patient Instructions  No change in sertraline dose for now - glad that has worked better. If worsening, I would recommend contacting therapist as discussed but let me know as well.   Continue heartburn medication daily.  If any worsening abdominal pain or more frequent abdominal pain be seen.  Tonsilloliths can come from multiple reasons including allergies, viruses causing sore throat, but if you continue to have irritation of that tonsil or enlarged tonsil I would recommend ear nose and throat evaluation.  I will check some blood work today including some other viruses but I expect the current soreness to improve through the weekend.  Follow-up next week  if that is not improving or any worsening sooner be seen at urgent care or ER.  I do not expect that to happen.  Let me know if there are questions.   Sore Throat A sore throat is pain, burning, irritation, or scratchiness in the throat. When you have a sore throat, you may feel pain or tenderness in your throat when you swallow or talk. Many things can cause a sore throat, including: An infection. Seasonal allergies. Dryness in the air. Irritants, such as smoke or pollution. Radiation treatment for cancer. Gastroesophageal reflux disease (GERD). A tumor. A sore throat is often the first sign of another sickness. It may happen with other symptoms, such as coughing, sneezing, fever, and swollen neck glands. Most sore throats go away without medical treatment. Follow these instructions at home:     Medicines Take over-the-counter and prescription medicines only as told by your health care provider. Children often get sore throats. Do not give your child aspirin because of the association with Reye's syndrome. Use throat sprays to soothe your throat as told by your health care provider. Managing pain To help with pain, try: Sipping warm liquids, such as broth, herbal tea, or warm water. Eating or drinking cold or frozen liquids, such as frozen ice pops. Gargling with a mixture of salt and water 3-4 times a day or as needed. To make salt water, completely dissolve -1 tsp (3-6 g) of salt in 1 cup (237 mL) of warm water. Sucking on hard candy or throat lozenges. Putting a cool-mist humidifier in your bedroom at night to moisten the air. Sitting in the bathroom with the door closed for 5-10 minutes while you run hot water in the shower. General instructions Do not use any products that contain nicotine or tobacco. These products include cigarettes, chewing tobacco, and vaping devices, such as e-cigarettes. If you need help quitting, ask your health care provider. Rest as needed. Drink  enough fluid to keep your urine pale yellow. Wash your hands often with soap  and water for at least 20 seconds. If soap and water are not available, use hand sanitizer. Contact a health care provider if: You have a fever for more than 2-3 days. You have symptoms that last for more than 2-3 days. Your throat does not get better within 7 days. You have a fever and your symptoms suddenly get worse. Get help right away if: You have difficulty breathing. You cannot swallow fluids, soft foods, or your saliva. You have increased swelling in your throat or neck. You have persistent nausea and vomiting. These symptoms may represent a serious problem that is an emergency. Do not wait to see if the symptoms will go away. Get medical help right away. Call your local emergency services (911 in the [place] Do not drive yourself to the hospital. Summary A sore throat is pain, burning, irritation, or scratchiness in the throat. Many things can cause a sore throat. Take over-the-counter medicines only as told by your health care provider. Rest as needed. Drink enough fluid to keep your urine pale yellow. Contact a health care provider if your throat does not get better within 7 days. This information is not intended to replace advice given to you by your health care provider. Make sure you discuss any questions you have with your health care provider. Document Revised: 11/19/2020 Document Reviewed: 11/19/2020 Elsevier Patient Education  2024 [employer]       Signed,   Meredith Staggers, MD [facility], [facility] 02/10/23 4:19 PM

## 2023-02-11 LAB — CBC
HCT: 37.5 % (ref 36.0–46.0)
Hemoglobin: 12 g/dL (ref 12.0–15.0)
MCHC: 31.9 g/dL (ref 30.0–36.0)
MCV: 83 fl (ref 78.0–100.0)
Platelets: 296 10*3/uL (ref 150.0–400.0)
RBC: 4.52 Mil/uL (ref 3.87–5.11)
RDW: 14.5 % (ref 11.5–15.5)
WBC: 13.6 10*3/uL — ABNORMAL HIGH (ref 4.0–10.5)

## 2023-02-11 LAB — EPSTEIN-BARR VIRUS VCA ANTIBODY PANEL
EBV NA IgG: 144 U/mL — ABNORMAL HIGH
EBV VCA IgG: 183 U/mL — ABNORMAL HIGH
EBV VCA IgM: <36 U/mL

## 2023-02-11 LAB — CMV IGM: CMV IgM: <30 AU/mL

## 2023-02-13 ENCOUNTER — Encounter: Payer: Self-pay | Admitting: Family Medicine

## 2023-02-18 ENCOUNTER — Other Ambulatory Visit (HOSPITAL_COMMUNITY): Payer: Self-pay

## 2023-02-21 ENCOUNTER — Other Ambulatory Visit (HOSPITAL_COMMUNITY): Payer: Self-pay

## 2023-02-23 ENCOUNTER — Other Ambulatory Visit (HOSPITAL_COMMUNITY): Payer: Self-pay

## 2023-05-06 DIAGNOSIS — H5213 Myopia, bilateral: Secondary | ICD-10-CM | POA: Diagnosis not present

## 2023-05-25 ENCOUNTER — Encounter: Payer: Self-pay | Admitting: Family Medicine

## 2023-05-25 ENCOUNTER — Ambulatory Visit: Payer: Commercial Managed Care - PPO | Admitting: Family Medicine

## 2023-05-25 ENCOUNTER — Other Ambulatory Visit (HOSPITAL_COMMUNITY): Payer: Self-pay

## 2023-05-25 VITALS — BP 118/88 | HR 99 | Temp 98.4°F | Wt 220.0 lb

## 2023-05-25 DIAGNOSIS — L03116 Cellulitis of left lower limb: Secondary | ICD-10-CM

## 2023-05-25 DIAGNOSIS — L02416 Cutaneous abscess of left lower limb: Secondary | ICD-10-CM | POA: Diagnosis not present

## 2023-05-25 MED ORDER — DOXYCYCLINE HYCLATE 100 MG PO TABS
100.0000 mg | ORAL_TABLET | Freq: Two times a day (BID) | ORAL | 0 refills | Status: AC
  Filled 2023-05-25: qty 20, 10d supply, fill #0

## 2023-05-25 NOTE — Patient Instructions (Signed)
Start doxycycline twice per day, warm compresses to the area and I will refer you to general surgery for likely aspiration of an abscess in that area.  If any fevers or chills, or acute worsening symptoms be seen but I do not expect that to occur.  Tylenol or ibuprofen temporarily for pain is fine.  Let me know if you have questions and hang in there.  Return to the clinic or go to the nearest emergency room if any of your symptoms worsen or new symptoms occur. Skin Abscess  A skin abscess is an infected area on or under your skin. It contains pus and other material. An abscess may also be called a furuncle, carbuncle, or boil. It is often the result of an infection caused by bacteria. An abscess can occur in or on almost any part of your body. Sometimes, an abscess may break open (rupture) on its own. In most cases, it will keep getting worse unless it is treated. An abscess can cause pain and make you feel ill. An untreated abscess can cause infection to spread to other parts of your body or your bloodstream. The abscess may need to be drained. You may also need to take antibiotics. What are the causes? An abscess occurs when germs, like bacteria, pass through your skin and cause an infection. This may be caused by: A scrape or cut on your skin. A puncture wound through your skin, such as a needle injection or insect bite. Blocked oil or sweat glands. Blocked and infected hair follicles. A fluid-filled sac that forms beneath your skin (sebaceous cyst) and becomes infected. What increases the risk? You may be more likely to develop an abscess if: You have problems with blood circulation, or you have a weak body defense system (immune system). You have diabetes. You have dry and irritated skin. You get injections often or use IV drugs. You have a foreign body in a wound, such as a splinter. You smoke or use tobacco products. What are the signs or symptoms? Symptoms of this condition  include: A painful, firm bump under the skin. A bump with pus at the top. This may break through the skin and drain. Other symptoms include: Redness and swelling around the abscess. Warmth or tenderness. Swelling of the lymph nodes (glands) near the abscess. A sore on the skin. How is this diagnosed? This condition may be diagnosed based on a physical exam and your medical history. You may also have tests done, such as: A test of a sample of pus. This may be done to find what is causing the infection. Blood tests. Imaging tests, such as an ultrasound, CT scan, or MRI. How is this treated? A small abscess that drains on its own may not need to be treated. Treatment for larger abscesses may include: Moist heat or a heat pack applied to the area a few times a day. Incision and drainage. This is a procedure to drain the abscess. Antibiotics. For a severe abscess, you may first get antibiotics through an IV and then change to antibiotics by mouth. Follow these instructions at home: Medicines Take over-the-counter and prescription medicines only as told by your provider. If you were prescribed antibiotics, take them as told by your provider. Do not stop using the antibiotic even if you start to feel better. Abscess care  If you have an abscess that has not drained, apply heat to the affected area. Use the heat source that your provider recommends, such as a moist  heat pack or a heating pad. Place a towel between your skin and the heat source. Leave the heat on for 20-30 minutes at a time. If your skin turns bright red, remove the heat right away to prevent burns. The risk of burns is higher if you cannot feel pain, heat, or cold. Follow instructions from your provider about how to take care of your abscess. Make sure you: Cover the abscess with a bandage (dressing). Wash your hands with soap and water for at least 20 seconds before and after you change the dressing or gauze. If soap and water  are not available, use hand sanitizer. Change your dressing or gauze as told by your provider. Check your abscess every day for signs of an infection that is getting worse. Check for: More redness, swelling, pain, or tenderness. More fluid or blood. Warmth. More pus or a worse smell. General instructions To avoid spreading the infection: Do not share personal care items, towels, or hot tubs with others. Avoid making skin contact with other people. Be careful when getting rid of used dressings, wound packing, or any drainage from the abscess. Do not use any products that contain nicotine or tobacco. These products include cigarettes, chewing tobacco, and vaping devices, such as e-cigarettes. If you need help quitting, ask your provider. Do not use any creams, ointments, or liquids unless you have been told to by your provider. Contact a health care provider if: You see redness that spreads quickly or red streaks on your skin spreading away from the abscess. You have any signs of worse infection at the abscess. You vomit every time you eat or drink. You have a fever, chills, or muscle aches. The cyst or abscess returns. Get help right away if: You have severe pain. You make less pee (urine) than normal. This information is not intended to replace advice given to you by your health care provider. Make sure you discuss any questions you have with your health care provider. Document Revised: 04/07/2022 Document Reviewed: 04/07/2022 Elsevier Patient Education  2024 [employer].

## 2023-05-25 NOTE — Progress Notes (Signed)
Subjective:  Patient ID: Alicia Beltran, female    DOB: Jan 10, 1986  Age: 37 y.o. MRN: [id number]  CC:  Chief Complaint  Patient presents with   Abscess    Monday morning noticed a small bump on inner left thigh. States she has gotten them before due to legs rubbing together when she walks. This time it has gotten worse since Monday. Has tried heat and ice, ibuprofen.     HPI Alicia Beltran presents for   L inner thigh bump: Started with small bump - 2 days ago.  Noticed in past with thighs rubbing together in past - usually go away on their own in 4-5 days.  Current one is worse - getting bigger, red. Painful. No fever. No discharge. Tx: heat, ice, ibuprofen, tylenol.    History There are no problems to display for this patient.  Past Medical History:  Diagnosis Date   Allergies    Anal fissure    Anxiety    Depression    GERD (gastroesophageal reflux disease)    Headache    Past Surgical History:  Procedure Laterality Date   HEMORROIDECTOMY  2010   LAPAROSCOPY ABDOMEN DIAGNOSTIC     LUMBAR LAMINECTOMY/DECOMPRESSION MICRODISCECTOMY Right 09/05/2019   Procedure: Right Lumbar five through Sacral one disectomy SAME DAY DISCHARGE;  Surgeon: Venita Lick, MD;  Location: [facility] OR;  Service: Orthopedics;  Laterality: Right;  2.5 hrs   No Known Allergies Prior to Admission medications   Medication Sig Start Date End Date Taking? Authorizing Provider  famotidine (PEPCID) 20 MG tablet Take 1 tablet (20 mg total) by mouth at bedtime. 08/23/22  Yes Imogene Burn, MD  omeprazole (PRILOSEC) 40 MG capsule Take 1 capsule by mouth 2 times daily before a meal. 02/10/23  Yes Shade Flood, MD  polyethylene glycol (MIRALAX / GLYCOLAX) 17 g packet Take 17 g by mouth daily.   Yes [provider]  sertraline (ZOLOFT) 100 MG tablet Take 1 tablet (100 mg total) by mouth daily. 02/10/23  Yes Shade Flood, MD  SUMAtriptan (IMITREX) 25 MG tablet Take 1 tablet (25 mg  total) by mouth as needed for migraine. May repeat in 2 hours if headache persists or recurs. Do not exceed 2 doses in 1 day. 03/24/22  Yes Shade Flood, MD  methylPREDNISolone (MEDROL) 4 MG TBPK tablet Take as directed for 6 days, for back pain. Patient not taking: Reported on 05/25/2023 11/30/22      Social History   Socioeconomic History   Marital status: Married    Spouse name: Not on file   Number of children: 0   Years of education: Not on file   Highest education level: Not on file  Occupational History   Occupation: CNA  Tobacco Use   Smoking status: Never   Smokeless tobacco: Never  Vaping Use   Vaping status: Never Used  Substance and Sexual Activity   Alcohol use: Not Currently   Drug use: No   Sexual activity: Yes    Partners: Male    Birth control/protection: None  Other Topics Concern   Not on file  Social History Narrative   Not on file   Social Determinants of Health   Financial Resource Strain: Not on file  Food Insecurity: Not on file  Transportation Needs: Not on file  Physical Activity: Not on file  Stress: Not on file  Social Connections: Not on file  Intimate Partner Violence: Not on file    Review of  Systems Per HPI.   Objective:   Vitals:   05/25/23 1623  BP: 118/88  Pulse: 99  Temp: 98.4 F (36.9 C)  TempSrc: Temporal  SpO2: 98%  Weight: 220 lb (99.8 kg)     Physical Exam Exam conducted with a chaperone present Thea Silversmith).  Constitutional:      General: She is not in acute distress.    Appearance: Normal appearance. She is well-developed.  HENT:     Head: Normocephalic and atraumatic.  Cardiovascular:     Rate and Rhythm: Normal rate.  Pulmonary:     Effort: Pulmonary effort is normal.  Skin:    General: Skin is warm.     Findings: Erythema present.       Neurological:     Mental Status: She is alert and oriented to person, place, and time.  Psychiatric:        Mood and Affect: Mood normal.          Assessment & Plan:  Alicia Beltran is a 37 y.o. female . Abscess of left thigh - Plan: doxycycline (VIBRA-TABS) 100 MG tablet, Ambulatory referral to General Surgery  Cellulitis of left thigh - Plan: doxycycline (VIBRA-TABS) 100 MG tablet, Ambulatory referral to General Surgery Left upper thigh cellulitis, with early abscess likely based on induration but no appreciable area of fluctuance.  Warm compresses discussed, start doxycycline, refer to general surgery in the next 48 hours for possible abscess aspiration at that time.  ER precautions given.  Pain control with Tylenol, ibuprofen for now but can call in something stronger if needed.  Meds ordered this encounter  Medications   doxycycline (VIBRA-TABS) 100 MG tablet    Sig: Take 1 tablet (100 mg total) by mouth 2 (two) times daily.    Dispense:  20 tablet    Refill:  0   Patient Instructions  Start doxycycline twice per day, warm compresses to the area and I will refer you to general surgery for likely aspiration of an abscess in that area.  If any fevers or chills, or acute worsening symptoms be seen but I do not expect that to occur.  Tylenol or ibuprofen temporarily for pain is fine.  Let me know if you have questions and hang in there.  Return to the clinic or go to the nearest emergency room if any of your symptoms worsen or new symptoms occur. Skin Abscess  A skin abscess is an infected area on or under your skin. It contains pus and other material. An abscess may also be called a furuncle, carbuncle, or boil. It is often the result of an infection caused by bacteria. An abscess can occur in or on almost any part of your body. Sometimes, an abscess may break open (rupture) on its own. In most cases, it will keep getting worse unless it is treated. An abscess can cause pain and make you feel ill. An untreated abscess can cause infection to spread to other parts of your body or your bloodstream. The abscess may  need to be drained. You may also need to take antibiotics. What are the causes? An abscess occurs when germs, like bacteria, pass through your skin and cause an infection. This may be caused by: A scrape or cut on your skin. A puncture wound through your skin, such as a needle injection or insect bite. Blocked oil or sweat glands. Blocked and infected hair follicles. A fluid-filled sac that forms beneath your skin (sebaceous cyst) and becomes infected. What increases  the risk? You may be more likely to develop an abscess if: You have problems with blood circulation, or you have a weak body defense system (immune system). You have diabetes. You have dry and irritated skin. You get injections often or use IV drugs. You have a foreign body in a wound, such as a splinter. You smoke or use tobacco products. What are the signs or symptoms? Symptoms of this condition include: A painful, firm bump under the skin. A bump with pus at the top. This may break through the skin and drain. Other symptoms include: Redness and swelling around the abscess. Warmth or tenderness. Swelling of the lymph nodes (glands) near the abscess. A sore on the skin. How is this diagnosed? This condition may be diagnosed based on a physical exam and your medical history. You may also have tests done, such as: A test of a sample of pus. This may be done to find what is causing the infection. Blood tests. Imaging tests, such as an ultrasound, CT scan, or MRI. How is this treated? A small abscess that drains on its own may not need to be treated. Treatment for larger abscesses may include: Moist heat or a heat pack applied to the area a few times a day. Incision and drainage. This is a procedure to drain the abscess. Antibiotics. For a severe abscess, you may first get antibiotics through an IV and then change to antibiotics by mouth. Follow these instructions at home: Medicines Take over-the-counter and  prescription medicines only as told by your provider. If you were prescribed antibiotics, take them as told by your provider. Do not stop using the antibiotic even if you start to feel better. Abscess care  If you have an abscess that has not drained, apply heat to the affected area. Use the heat source that your provider recommends, such as a moist heat pack or a heating pad. Place a towel between your skin and the heat source. Leave the heat on for 20-30 minutes at a time. If your skin turns bright red, remove the heat right away to prevent burns. The risk of burns is higher if you cannot feel pain, heat, or cold. Follow instructions from your provider about how to take care of your abscess. Make sure you: Cover the abscess with a bandage (dressing). Wash your hands with soap and water for at least 20 seconds before and after you change the dressing or gauze. If soap and water are not available, use hand sanitizer. Change your dressing or gauze as told by your provider. Check your abscess every day for signs of an infection that is getting worse. Check for: More redness, swelling, pain, or tenderness. More fluid or blood. Warmth. More pus or a worse smell. General instructions To avoid spreading the infection: Do not share personal care items, towels, or hot tubs with others. Avoid making skin contact with other people. Be careful when getting rid of used dressings, wound packing, or any drainage from the abscess. Do not use any products that contain nicotine or tobacco. These products include cigarettes, chewing tobacco, and vaping devices, such as e-cigarettes. If you need help quitting, ask your provider. Do not use any creams, ointments, or liquids unless you have been told to by your provider. Contact a health care provider if: You see redness that spreads quickly or red streaks on your skin spreading away from the abscess. You have any signs of worse infection at the abscess. You  vomit every time you  eat or drink. You have a fever, chills, or muscle aches. The cyst or abscess returns. Get help right away if: You have severe pain. You make less pee (urine) than normal. This information is not intended to replace advice given to you by your health care provider. Make sure you discuss any questions you have with your health care provider. Document Revised: 04/07/2022 Document Reviewed: 04/07/2022 Elsevier Patient Education  2024 Elsevier Inc.      Signed,   Meredith Staggers, MD [facility], [facility] 05/25/23 5:33 PM

## 2023-05-26 ENCOUNTER — Other Ambulatory Visit (HOSPITAL_COMMUNITY): Payer: Self-pay

## 2023-05-26 ENCOUNTER — Telehealth: Payer: Self-pay | Admitting: Family Medicine

## 2023-05-26 NOTE — Telephone Encounter (Signed)
Message received from referrals that surgery has tried to contact patient but unsuccessful. I also called patient -no answer, left voicemail.  Please try to call her in the morning and make sure she has either received message from [facility] surgery and scheduled an appointment, or provide her their contact number to schedule appointment.  Check status of cellulitis.  Let me know if I can help further.

## 2023-05-27 DIAGNOSIS — L03116 Cellulitis of left lower limb: Secondary | ICD-10-CM | POA: Diagnosis not present

## 2023-05-27 NOTE — Telephone Encounter (Signed)
I have spoken to the pt and she is aware.

## 2023-06-02 ENCOUNTER — Other Ambulatory Visit (HOSPITAL_COMMUNITY): Payer: Self-pay

## 2023-08-15 ENCOUNTER — Ambulatory Visit: Payer: Commercial Managed Care - PPO | Admitting: Family Medicine

## 2023-10-25 ENCOUNTER — Other Ambulatory Visit (HOSPITAL_COMMUNITY): Payer: Self-pay

## 2023-10-25 ENCOUNTER — Telehealth: Payer: Commercial Managed Care - PPO | Admitting: Physician Assistant

## 2023-10-25 DIAGNOSIS — B9689 Other specified bacterial agents as the cause of diseases classified elsewhere: Secondary | ICD-10-CM | POA: Diagnosis not present

## 2023-10-25 DIAGNOSIS — J208 Acute bronchitis due to other specified organisms: Secondary | ICD-10-CM

## 2023-10-25 MED ORDER — PREDNISONE 10 MG (21) PO TBPK
ORAL_TABLET | ORAL | 0 refills | Status: DC
  Filled 2023-10-25: qty 21, 6d supply, fill #0

## 2023-10-25 MED ORDER — AZITHROMYCIN 250 MG PO TABS
ORAL_TABLET | ORAL | 0 refills | Status: AC
  Filled 2023-10-25: qty 6, 5d supply, fill #0

## 2023-10-25 MED ORDER — BENZONATATE 100 MG PO CAPS
100.0000 mg | ORAL_CAPSULE | Freq: Three times a day (TID) | ORAL | 0 refills | Status: AC | PRN
  Filled 2023-10-25: qty 30, 5d supply, fill #0

## 2023-10-25 NOTE — Progress Notes (Signed)
E-Visit for Cough  We are sorry that you are not feeling well.  Here is how we plan to help!  Based on your presentation I believe you most likely have A cough due to bacteria.  When patients have a fever and a productive cough with a change in color or increased sputum production, we are concerned about bacterial bronchitis.  If left untreated it can progress to pneumonia.  If your symptoms do not improve with your treatment plan it is important that you contact your provider.   I have prescribed Azithromyin 250 mg: two tablets now and then one tablet daily for 4 additonal days    In addition you may use A non-prescription cough medication called Mucinex DM: take 2 tablets every 12 hours. and A prescription cough medication called Tessalon Perles 100mg . You may take 1-2 capsules every 8 hours as needed for your cough.  Prednisone 10 mg daily for 6 days (see taper instructions below)  Directions for 6 day taper: Day 1: 2 tablets before breakfast, 1 after both lunch & dinner and 2 at bedtime Day 2: 1 tab before breakfast, 1 after both lunch & dinner and 2 at bedtime Day 3: 1 tab at each meal & 1 at bedtime Day 4: 1 tab at breakfast, 1 at lunch, 1 at bedtime Day 5: 1 tab at breakfast & 1 tab at bedtime Day 6: 1 tab at breakfast  From your responses in the eVisit questionnaire you describe inflammation in the upper respiratory tract which is causing a significant cough.  This is commonly called Bronchitis and has four common causes:   Allergies Viral Infections Acid Reflux Bacterial Infection Allergies, viruses and acid reflux are treated by controlling symptoms or eliminating the cause. An example might be a cough caused by taking certain blood pressure medications. You stop the cough by changing the medication. Another example might be a cough caused by acid reflux. Controlling the reflux helps control the cough.  USE OF BRONCHODILATOR ("RESCUE") INHALERS: There is a risk from using your  bronchodilator too frequently.  The risk is that over-reliance on a medication which only relaxes the muscles surrounding the breathing tubes can reduce the effectiveness of medications prescribed to reduce swelling and congestion of the tubes themselves.  Although you feel brief relief from the bronchodilator inhaler, your asthma may actually be worsening with the tubes becoming more swollen and filled with mucus.  This can delay other crucial treatments, such as oral steroid medications. If you need to use a bronchodilator inhaler daily, several times per day, you should discuss this with your provider.  There are probably better treatments that could be used to keep your asthma under control.     HOME CARE Only take medications as instructed by your medical team. Complete the entire course of an antibiotic. Drink plenty of fluids and get plenty of rest. Avoid close contacts especially the very young and the elderly Cover your mouth if you cough or cough into your sleeve. Always remember to wash your hands A steam or ultrasonic humidifier can help congestion.   GET HELP RIGHT AWAY IF: You develop worsening fever. You become short of breath You cough up blood. Your symptoms persist after you have completed your treatment plan MAKE SURE YOU  Understand these instructions. Will watch your condition. Will get help right away if you are not doing well or get worse.    Thank you for choosing an e-visit.  Your e-visit answers were reviewed by a  board certified advanced clinical practitioner to complete your personal care plan. Depending upon the condition, your plan could have included both over the counter or prescription medications.  Please review your pharmacy choice. Make sure the pharmacy is open so you can pick up prescription now. If there is a problem, you may contact your provider through [employer] and have the prescription routed to another pharmacy.  Your safety is important  to [place]. If you have drug allergies check your prescription carefully.   For the next 24 hours you can use MyChart to ask questions about today's visit, request a non-urgent call back, or ask for a work or school excuse. You will get an email in the next two days asking about your experience. I hope that your e-visit has been valuable and will speed your recovery.  I have spent 5 minutes in review of e-visit questionnaire, review and updating patient chart, medical decision making and response to patient.   Margaretann Loveless, PA-C

## 2023-10-26 ENCOUNTER — Other Ambulatory Visit (HOSPITAL_COMMUNITY): Payer: Self-pay

## 2023-11-10 ENCOUNTER — Other Ambulatory Visit (HOSPITAL_COMMUNITY): Payer: Self-pay

## 2023-11-10 ENCOUNTER — Ambulatory Visit: Admitting: Family Medicine

## 2023-11-10 ENCOUNTER — Encounter: Payer: Self-pay | Admitting: Family Medicine

## 2023-11-10 VITALS — BP 126/70 | HR 103 | Temp 98.4°F | Ht 65.0 in

## 2023-11-10 DIAGNOSIS — R0600 Dyspnea, unspecified: Secondary | ICD-10-CM

## 2023-11-10 DIAGNOSIS — J22 Unspecified acute lower respiratory infection: Secondary | ICD-10-CM | POA: Diagnosis not present

## 2023-11-10 DIAGNOSIS — R052 Subacute cough: Secondary | ICD-10-CM | POA: Diagnosis not present

## 2023-11-10 MED ORDER — ALBUTEROL SULFATE HFA 108 (90 BASE) MCG/ACT IN AERS
2.0000 | INHALATION_SPRAY | Freq: Four times a day (QID) | RESPIRATORY_TRACT | 0 refills | Status: AC | PRN
  Filled 2023-11-10: qty 6.7, 25d supply, fill #0

## 2023-11-10 MED ORDER — PREDNISONE 20 MG PO TABS
40.0000 mg | ORAL_TABLET | Freq: Every day | ORAL | 0 refills | Status: AC
  Filled 2023-11-10: qty 10, 5d supply, fill #0

## 2023-11-10 MED ORDER — ROBITUSSIN COUGH+CHEST CONG DM 20-200 MG/20ML PO LIQD
5.0000 mL | Freq: Four times a day (QID) | ORAL | 0 refills | Status: AC | PRN
  Filled 2023-11-10: qty 118, 3d supply, fill #0

## 2023-11-10 NOTE — Patient Instructions (Signed)
 Sorry to hear that you are still coughing.  If the albuterol has been helpful and recurrent need for albuterol we can try prednisone 1 more time in case this could be reactive airway.  Restart omeprazole once per day in case there could be a component of heartburn or reflux.  Codeine cough syrup if needed to suppress cough, especially at bedtime.  Please have x-ray performed at the [facility] location below and if any concerns on x-ray or lab work from today I would let you know.  Give me an update next week when you know how you are doing.  If any worsening symptoms be seen.  Hang in there.  [facility] or xray: Walk in 8:30-4:30 during weekdays, no appointment needed [address]  [place], [place] [address]   Cough, Adult Coughing is a reflex that clears your throat and airways (respiratory system). It helps heal and protect your lungs. It is normal to cough from time to time. A cough that happens with other symptoms or that lasts a long time may be a sign of a condition that needs treatment. A short-term (acute) cough may only last 2-3 weeks. A long-term (chronic) cough may last 8 or more weeks. Coughing is often caused by: Diseases, such as: An infection of the respiratory system. Asthma or other heart or lung diseases. Gastroesophageal reflux. This is when acid comes back up from the stomach. Breathing in things that irritate your lungs. Allergies. Postnasal drip. This is when mucus runs down the back of your throat. Smoking. Some medicines. Follow these instructions at home: Medicines Take over-the-counter and prescription medicines only as told by your health care provider. Talk with your provider before you take cough medicine (cough suppressants). Eating and drinking Do not drink alcohol. Avoid caffeine. Drink enough fluid to keep your pee (urine) pale yellow. Lifestyle Avoid cigarette smoke. Do not use any products that contain nicotine or tobacco. These products include  cigarettes, chewing tobacco, and vaping devices, such as e-cigarettes. If you need help quitting, ask your provider. Avoid things that make you cough. These may include perfumes, candles, cleaning products, or campfire smoke. General instructions  Watch for any changes to your cough. Tell your provider about them. Always cover your mouth when you cough. If the air is dry in your bedroom or home, use a cool mist vaporizer or humidifier. If your cough is worse at night, try to sleep in a semi-upright position. Rest as needed. Contact a health care provider if: You have new symptoms, or your symptoms get worse. You cough up pus. You have a fever that does not go away or a cough that does not get better after 2-3 weeks. You cannot control your cough with medicine, and you are losing sleep. You have pain that gets worse or is not helped with medicine. You lose weight for no clear reason. You have night sweats. Get help right away if: You cough up blood. You have trouble breathing. Your heart is beating very fast. These symptoms may be an emergency. Get help right away. Call 911. Do not wait to see if the symptoms will go away. Do not drive yourself to the hospital. This information is not intended to replace advice given to you by your health care provider. Make sure you discuss any questions you have with your health care provider. Document Revised: 04/23/2022 Document Reviewed: 04/23/2022 Elsevier Patient Education  2024 [employer].

## 2023-11-10 NOTE — Progress Notes (Signed)
 Subjective:  Patient ID: Alicia Beltran, female    DOB: [birth year]  Age: 38 y.o. MRN: [id number]  CC:  Chief Complaint  Patient presents with   Cough    Pt has been feeling unwell 10/13/2023 did not go to the dr at that time, tried a Evisit later, notes cough, and sore throat would not go away, was prescribed erythromycin and prednisone and notes this has not resolved the concern despite finishing both medications.     HPI Alicia Beltran presents for   Cough: As above.  Has not felt well past month.  Cough, sore throat on about 2/6. Treated with mucinex, tylenol, delsym, vit C, zinc. Body aches better, cough and raspy voice persistent.  Some shortness of breath. had e-visit 2/18 - prescribed Zpak, tessalon, prednisone for bronchitis. Has finished both and still with persistent symptoms. No change in cough. Still with cough in day, night. No recent fever. Irritated throat, but no pain. Clearing secretions. Drinking fluids ok.  No chest pains, just sore to cough.  No heartburn.  omeprazole as needed for heartburn, no recent use. Usually nonproductive cough.   Current tx: mucinex, flonase, claritin. Albuterol every 6 hours - min improvement.    Controlled substance database reviewed.  No listings.  History There are no active problems to display for this patient.  Past Medical History:  Diagnosis Date   Allergies    Anal fissure    Anxiety    Depression    GERD (gastroesophageal reflux disease)    Headache    Past Surgical History:  Procedure Laterality Date   HEMORROIDECTOMY  2010   LAPAROSCOPY ABDOMEN DIAGNOSTIC     LUMBAR LAMINECTOMY/DECOMPRESSION MICRODISCECTOMY Right 09/05/2019   Procedure: Right Lumbar five through Sacral one disectomy SAME DAY DISCHARGE;  Surgeon: Venita Lick, MD;  Location: [facility] OR;  Service: Orthopedics;  Laterality: Right;  2.5 hrs   No Known Allergies Prior to Admission medications   Medication Sig Start Date End Date Taking?  Authorizing Provider  benzonatate (TESSALON) 100 MG capsule Take 1-2 capsules (100-200 mg total) by mouth 3 (three) times daily as needed. 10/25/23   Margaretann Loveless, PA-C  doxycycline (VIBRA-TABS) 100 MG tablet Take 1 tablet (100 mg total) by mouth 2 (two) times daily. 05/25/23   Shade Flood, MD  famotidine (PEPCID) 20 MG tablet Take 1 tablet (20 mg total) by mouth at bedtime. 08/23/22   Imogene Burn, MD  omeprazole (PRILOSEC) 40 MG capsule Take 1 capsule by mouth 2 times daily before a meal. 02/10/23   Shade Flood, MD  polyethylene glycol (MIRALAX / GLYCOLAX) 17 g packet Take 17 g by mouth daily.    [provider]  predniSONE (STERAPRED UNI-PAK 21 TAB) 10 MG (21) TBPK tablet Taper as directed on package instructions over 6 days 10/25/23   Margaretann Loveless, PA-C  sertraline (ZOLOFT) 100 MG tablet Take 1 tablet (100 mg total) by mouth daily. 02/10/23   Shade Flood, MD  SUMAtriptan (IMITREX) 25 MG tablet Take 1 tablet (25 mg total) by mouth as needed for migraine. May repeat in 2 hours if headache persists or recurs. Do not exceed 2 doses in 1 day. 03/24/22   Shade Flood, MD   Social History   Socioeconomic History   Marital status: Married    Spouse name: Not on file   Number of children: 0   Years of education: Not on file   Highest education level: Not on file  Occupational History   Occupation: CNA  Tobacco Use   Smoking status: Never   Smokeless tobacco: Never  Vaping Use   Vaping status: Never Used  Substance and Sexual Activity   Alcohol use: Not Currently   Drug use: No   Sexual activity: Yes    Partners: Male    Birth control/protection: None  Other Topics Concern   Not on file  Social History Narrative   Not on file   Social Drivers of Health   Financial Resource Strain: Not on file  Food Insecurity: Not on file  Transportation Needs: Not on file  Physical Activity: Not on file  Stress: Not on file  Social Connections: Not on  file  Intimate Partner Violence: Not on file    Review of Systems Per HPI  Objective:   Vitals:   11/10/23 1437  BP: 126/70  Pulse: (!) 103  Temp: 98.4 F (36.9 C)  TempSrc: Temporal  SpO2: 99%  Height: 5\' 5"  (1.651 m)     Physical Exam Vitals reviewed.  Constitutional:      General: She is not in acute distress.    Appearance: She is well-developed.  HENT:     Head: Normocephalic and atraumatic.     Right Ear: Hearing, tympanic membrane, ear canal and external ear normal.     Left Ear: Hearing, tympanic membrane, ear canal and external ear normal.     Nose: Nose normal.     Mouth/Throat:     Pharynx: No posterior oropharyngeal erythema.     Comments: Moist oral mucosa, no exudate in posterior pharynx, no white patches of tongue or oral mucosa.  Clearing secretions without apparent soft tissue swelling.   Normal speech, no stridor. Eyes:     Conjunctiva/sclera: Conjunctivae normal.     Pupils: Pupils are equal, round, and reactive to light.  Cardiovascular:     Rate and Rhythm: Normal rate and regular rhythm.     Heart sounds: Normal heart sounds. No murmur heard. Pulmonary:     Effort: Pulmonary effort is normal. No respiratory distress.     Breath sounds: Normal breath sounds. No stridor. No wheezing or rhonchi.  Skin:    General: Skin is warm and dry.     Findings: No rash.  Neurological:     Mental Status: She is alert and oriented to person, place, and time.  Psychiatric:        Mood and Affect: Mood normal.        Behavior: Behavior normal.        Assessment & Plan:  Alicia Beltran is a 38 y.o. female . Subacute cough - Plan: CBC, DG Chest 2 View, Dextromethorphan-guaiFENesin (ROBITUSSIN COUGH+CHEST CONG DM) 20-200 MG/20ML LIQD, predniSONE (DELTASONE) 20 MG tablet  Dyspnea, unspecified type - Plan: CBC, Basic metabolic panel, DG Chest 2 View, albuterol (VENTOLIN HFA) 108 (90 Base) MCG/ACT inhaler  LRTI (lower respiratory tract infection) -  Plan: DG Chest 2 View  Approximately 1 month of cough, initial sore throat/throat irritation.  Suspected viral illness initially but persistent symptoms.  Those have also continued after initial prednisone and azithromycin treatment.  She has been using albuterol frequently with some improvement, question reactive airway component.  History of GERD, infrequent use of omeprazole without recent heartburn but could be contributor to cough.  -Check chest x-ray, CBC, BMP  -Restart omeprazole daily  -Repeat prednisone, refilled albuterol if needed for wheeze, dyspnea, cough.  -Codeine cough syrup if needed cough suppression as long as  she is not wheezing, treatment of wheeze as above  -Update on symptoms in the next 1 week with RTC/ER precautions given  Meds ordered this encounter  Medications   Dextromethorphan-guaiFENesin (ROBITUSSIN COUGH+CHEST CONG DM) 20-200 MG/20ML LIQD    Sig: Take 5-10 mLs by mouth every 6 (six) hours as needed.    Dispense:  120 mL    Refill:  0   predniSONE (DELTASONE) 20 MG tablet    Sig: Take 2 tablets (40 mg total) by mouth daily with breakfast.    Dispense:  10 tablet    Refill:  0   albuterol (VENTOLIN HFA) 108 (90 Base) MCG/ACT inhaler    Sig: Inhale 2 puffs into the lungs every 6 (six) hours as needed for wheezing or shortness of breath.    Dispense:  8 g    Refill:  0   Patient Instructions  Sorry to hear that you are still coughing.  If the albuterol has been helpful and recurrent need for albuterol we can try prednisone 1 more time in case this could be reactive airway.  Restart omeprazole once per day in case there could be a component of heartburn or reflux.  Codeine cough syrup if needed to suppress cough, especially at bedtime.  Please have x-ray performed at the [facility] location below and if any concerns on x-ray or lab work from today I would let you know.  Give me an update next week when you know how you are doing.  If any worsening symptoms be  seen.  Hang in there.  [facility] or xray: Walk in 8:30-4:30 during weekdays, no appointment needed [address]  [place], [place] [address]   Cough, Adult Coughing is a reflex that clears your throat and airways (respiratory system). It helps heal and protect your lungs. It is normal to cough from time to time. A cough that happens with other symptoms or that lasts a long time may be a sign of a condition that needs treatment. A short-term (acute) cough may only last 2-3 weeks. A long-term (chronic) cough may last 8 or more weeks. Coughing is often caused by: Diseases, such as: An infection of the respiratory system. Asthma or other heart or lung diseases. Gastroesophageal reflux. This is when acid comes back up from the stomach. Breathing in things that irritate your lungs. Allergies. Postnasal drip. This is when mucus runs down the back of your throat. Smoking. Some medicines. Follow these instructions at home: Medicines Take over-the-counter and prescription medicines only as told by your health care provider. Talk with your provider before you take cough medicine (cough suppressants). Eating and drinking Do not drink alcohol. Avoid caffeine. Drink enough fluid to keep your pee (urine) pale yellow. Lifestyle Avoid cigarette smoke. Do not use any products that contain nicotine or tobacco. These products include cigarettes, chewing tobacco, and vaping devices, such as e-cigarettes. If you need help quitting, ask your provider. Avoid things that make you cough. These may include perfumes, candles, cleaning products, or campfire smoke. General instructions  Watch for any changes to your cough. Tell your provider about them. Always cover your mouth when you cough. If the air is dry in your bedroom or home, use a cool mist vaporizer or humidifier. If your cough is worse at night, try to sleep in a semi-upright position. Rest as needed. Contact a health care provider if: You  have new symptoms, or your symptoms get worse. You cough up pus. You have a fever  that does not go away or a cough that does not get better after 2-3 weeks. You cannot control your cough with medicine, and you are losing sleep. You have pain that gets worse or is not helped with medicine. You lose weight for no clear reason. You have night sweats. Get help right away if: You cough up blood. You have trouble breathing. Your heart is beating very fast. These symptoms may be an emergency. Get help right away. Call 911. Do not wait to see if the symptoms will go away. Do not drive yourself to the hospital. This information is not intended to replace advice given to you by your health care provider. Make sure you discuss any questions you have with your health care provider. Document Revised: 04/23/2022 Document Reviewed: 04/23/2022 Elsevier Patient Education  2024 [employer].    Signed,   Meredith Staggers, MD [facility], [facility] 11/10/23 11:11 PM

## 2023-11-11 ENCOUNTER — Encounter: Payer: Self-pay | Admitting: Family Medicine

## 2023-11-11 ENCOUNTER — Ambulatory Visit
Admission: RE | Admit: 2023-11-11 | Discharge: 2023-11-11 | Disposition: A | Discharge status: Home / Self Care | Source: Ambulatory Visit | Attending: Family Medicine | Admitting: Family Medicine

## 2023-11-11 ENCOUNTER — Other Ambulatory Visit

## 2023-11-11 DIAGNOSIS — R0600 Dyspnea, unspecified: Secondary | ICD-10-CM | POA: Diagnosis not present

## 2023-11-11 DIAGNOSIS — R052 Subacute cough: Secondary | ICD-10-CM | POA: Diagnosis not present

## 2023-11-11 DIAGNOSIS — J22 Unspecified acute lower respiratory infection: Secondary | ICD-10-CM | POA: Diagnosis not present

## 2023-11-11 DIAGNOSIS — R053 Chronic cough: Secondary | ICD-10-CM | POA: Diagnosis not present

## 2023-11-11 LAB — BASIC METABOLIC PANEL
BUN: 11 mg/dL (ref 6–23)
CO2: 21 meq/L (ref 19–32)
Calcium: 9.1 mg/dL (ref 8.4–10.5)
Chloride: 105 meq/L (ref 96–112)
Creatinine, Ser: 0.76 mg/dL (ref 0.40–1.20)
GFR: 98.64 mL/min (ref >60.00)
Glucose, Bld: 87 mg/dL (ref 70–99)
Potassium: 4 meq/L (ref 3.5–5.1)
Sodium: 141 meq/L (ref 135–145)

## 2023-11-11 LAB — CBC
HCT: 38.3 % (ref 36.0–46.0)
Hemoglobin: 12.2 g/dL (ref 12.0–15.0)
MCHC: 31.8 g/dL (ref 30.0–36.0)
MCV: 86.5 fl (ref 78.0–100.0)
Platelets: 325 10*3/uL (ref 150.0–400.0)
RBC: 4.43 Mil/uL (ref 3.87–5.11)
RDW: 15.4 % (ref 11.5–15.5)
WBC: 13.3 10*3/uL — ABNORMAL HIGH (ref 4.0–10.5)

## 2023-11-11 NOTE — Addendum Note (Signed)
 Addended by: Eldred Manges on: 11/11/2023 08:50 AM   Modules accepted: Orders

## 2024-03-27 DIAGNOSIS — L821 Other seborrheic keratosis: Secondary | ICD-10-CM | POA: Diagnosis not present

## 2024-03-27 DIAGNOSIS — L578 Other skin changes due to chronic exposure to nonionizing radiation: Secondary | ICD-10-CM | POA: Diagnosis not present

## 2024-03-27 DIAGNOSIS — D225 Melanocytic nevi of trunk: Secondary | ICD-10-CM | POA: Diagnosis not present

## 2024-03-27 DIAGNOSIS — L814 Other melanin hyperpigmentation: Secondary | ICD-10-CM | POA: Diagnosis not present

## 2024-07-23 DIAGNOSIS — M25512 Pain in left shoulder: Secondary | ICD-10-CM | POA: Diagnosis not present

## 2024-07-23 DIAGNOSIS — M67814 Other specified disorders of tendon, left shoulder: Secondary | ICD-10-CM | POA: Diagnosis not present

## 2024-08-20 DIAGNOSIS — M25512 Pain in left shoulder: Secondary | ICD-10-CM | POA: Diagnosis not present

## 2024-08-20 DIAGNOSIS — M7542 Impingement syndrome of left shoulder: Secondary | ICD-10-CM | POA: Diagnosis not present

## 2024-09-12 ENCOUNTER — Other Ambulatory Visit (HOSPITAL_COMMUNITY): Payer: Self-pay

## 2024-09-12 MED ORDER — MELOXICAM 15 MG PO TABS
15.0000 mg | ORAL_TABLET | Freq: Every day | ORAL | 2 refills | Status: AC
  Filled 2024-09-12: qty 30, 30d supply, fill #0
